# Patient Record
Sex: Male | Born: 1980
Health system: Southern US, Community
[De-identification: ages and names within clinical notes are randomized; demographics above are authoritative.]

## PROBLEM LIST (undated history)

## (undated) DIAGNOSIS — I10 Essential (primary) hypertension: Secondary | ICD-10-CM

## (undated) DIAGNOSIS — J45909 Unspecified asthma, uncomplicated: Secondary | ICD-10-CM

## (undated) DIAGNOSIS — K219 Gastro-esophageal reflux disease without esophagitis: Secondary | ICD-10-CM

## (undated) DIAGNOSIS — T7840XA Allergy, unspecified, initial encounter: Secondary | ICD-10-CM

## (undated) HISTORY — DX: Allergy, unspecified, initial encounter: T78.40XA

## (undated) HISTORY — DX: Essential (primary) hypertension: I10

## (undated) HISTORY — DX: Gastro-esophageal reflux disease without esophagitis: K21.9

## (undated) HISTORY — DX: Unspecified asthma, uncomplicated: J45.909

## (undated) HISTORY — PX: NO PAST SURGERIES: SHX2092

---

## 2000-04-02 ENCOUNTER — Emergency Department (HOSPITAL_COMMUNITY): Admission: EM | Admit: 2000-04-02 | Discharge: 2000-04-02 | Payer: Self-pay | Admitting: Emergency Medicine

## 2000-04-03 ENCOUNTER — Encounter: Payer: Self-pay | Admitting: Emergency Medicine

## 2012-11-05 ENCOUNTER — Ambulatory Visit (INDEPENDENT_AMBULATORY_CARE_PROVIDER_SITE_OTHER): Payer: BC Managed Care – PPO | Admitting: Family Medicine

## 2012-11-05 VITALS — BP 144/96 | HR 89 | Temp 98.3°F | Resp 16 | Ht 73.0 in | Wt 222.4 lb

## 2012-11-05 DIAGNOSIS — M79622 Pain in left upper arm: Secondary | ICD-10-CM

## 2012-11-05 DIAGNOSIS — K911 Postgastric surgery syndromes: Secondary | ICD-10-CM

## 2012-11-05 DIAGNOSIS — K219 Gastro-esophageal reflux disease without esophagitis: Secondary | ICD-10-CM

## 2012-11-05 DIAGNOSIS — R079 Chest pain, unspecified: Secondary | ICD-10-CM

## 2012-11-05 DIAGNOSIS — R109 Unspecified abdominal pain: Secondary | ICD-10-CM

## 2012-11-05 DIAGNOSIS — G5622 Lesion of ulnar nerve, left upper limb: Secondary | ICD-10-CM

## 2012-11-05 DIAGNOSIS — M79609 Pain in unspecified limb: Secondary | ICD-10-CM

## 2012-11-05 DIAGNOSIS — K625 Hemorrhage of anus and rectum: Secondary | ICD-10-CM

## 2012-11-05 LAB — POCT CBC
Granulocyte percent: 59.7 %G (ref 37–80)
HCT, POC: 48.4 % (ref 43.5–53.7)
Hemoglobin: 15.9 g/dL (ref 14.1–18.1)
Lymph, poc: 2.4 (ref 0.6–3.4)
MCH, POC: 29.8 pg (ref 27–31.2)
MCHC: 32.9 g/dL (ref 31.8–35.4)
MCV: 90.9 fL (ref 80–97)
MID (cbc): 0.7 (ref 0–0.9)
MPV: 10.4 fL (ref 0–99.8)
POC Granulocyte: 4.5 (ref 2–6.9)
POC LYMPH PERCENT: 31.3 %L (ref 10–50)
POC MID %: 9 %M (ref 0–12)
Platelet Count, POC: 256 10*3/uL (ref 142–424)
RBC: 5.33 M/uL (ref 4.69–6.13)
RDW, POC: 13.9 %
WBC: 7.6 10*3/uL (ref 4.6–10.2)

## 2012-11-05 LAB — COMPREHENSIVE METABOLIC PANEL
ALT: 114 U/L — ABNORMAL HIGH (ref 0–53)
AST: 51 U/L — ABNORMAL HIGH (ref 0–37)
Albumin: 5.2 g/dL (ref 3.5–5.2)
Alkaline Phosphatase: 42 U/L (ref 39–117)
BUN: 10 mg/dL (ref 6–23)
CO2: 26 mEq/L (ref 19–32)
Calcium: 10.1 mg/dL (ref 8.4–10.5)
Chloride: 103 mEq/L (ref 96–112)
Creat: 0.99 mg/dL (ref 0.50–1.35)
Glucose, Bld: 74 mg/dL (ref 70–99)
Potassium: 4.2 mEq/L (ref 3.5–5.3)
Sodium: 140 mEq/L (ref 135–145)
Total Bilirubin: 0.8 mg/dL (ref 0.3–1.2)
Total Protein: 7.8 g/dL (ref 6.0–8.3)

## 2012-11-05 LAB — IFOBT (OCCULT BLOOD): IFOBT: NEGATIVE

## 2012-11-05 MED ORDER — PANTOPRAZOLE SODIUM 40 MG PO TBEC: 40.0000 mg | DELAYED_RELEASE_TABLET | Freq: Every day | ORAL | Status: DC

## 2012-11-05 MED ORDER — PREDNISONE 20 MG PO TABS: ORAL_TABLET | ORAL | Status: DC

## 2012-11-05 NOTE — Progress Notes (Signed)
Subjective: 32 year old male who has generally been healthy. He has several complaints today. He's been having pain in his left upper arm to the posterior lateral aspect of it, going up into the left upper back above the scapula. Knows of no specific injury. He has no neck complaints. He does have intermittent numbness in the fourth and fifth fingers of his left hand.  He has a long history of gastroesophageal reflux and heartburn. He usually takes some TUMS and it will calm down for a while, then come back. He tried Prilosec OTC for 2 weeks and it helped but the pain came back. He also describes having some generalized low abdominal pain. He has a dumping syndrome, and when he has the urge to go he has to go. He passed gas and had some mucus come out recently, which when he checked and wiped himself he did see there is blind present. In the mucus.  He is working at a [employer]ater[employer] a computer most of the time. He is married has 2 children. Does not get a lot of regular exercise other than playing with children.  Objective: Pleasant gentleman no major distress. TMs normal. Throat clear. Neck supple without significant nodes. No crepitance was noted. Chest is clear to auscultation. Heart regular without murmurs. He has no gross anomalies of his left shoulder or upper arm or hand. Grip is symmetrical. His abdomen had normal bowel sounds, soft without mass or tenderness. Normal male external genitalia. Digital rectal exam was normal. No anal lesions were noted. Hemoccult was taken. He does have a little cyst in the soft tissues of the left side of his neck.  Assessment: GERD new left shoulder and arm pain Left ulnar neuropathy Abdominal pain Dumping syndrome  Plan: Check CBC, C. met, immature, and H. pylori. EKG was done and was normal.  Results for orders placed in visit on 11/05/12  POCT CBC      Result Value Range   WBC 7.6  4.6 - 10.2 K/uL   Lymph, poc 2.4  0.6 - 3.4   POC LYMPH PERCENT 31.3   10 - 50 %L   MID (cbc) 0.7  0 - 0.9   POC MID % 9.0  0 - 12 %M   POC Granulocyte 4.5  2 - 6.9   Granulocyte percent 59.7  37 - 80 %G   RBC 5.33  4.69 - 6.13 M/uL   Hemoglobin 15.9  14.1 - 18.1 g/dL   HCT, POC 16.1  09.6 - 53.7 %   MCV 90.9  80 - 97 fL   MCH, POC 29.8  27 - 31.2 pg   MCHC 32.9  31.8 - 35.4 g/dL   RDW, POC 04.5     Platelet Count, POC 256  142 - 424 K/uL   MPV 10.4  0 - 99.8 fL  IFOBT (OCCULT BLOOD)      Result Value Range   IFOBT Negative     I do not know exactly what is causing the arm pain. However I think it's mechanical in some fashion that I will treat with prednisone. Treat his stomach with Protonix while we are waiting for the blood work come back.

## 2012-11-05 NOTE — Patient Instructions (Addendum)
Take the prednisone 3 daily for 2 days, then 2 daily for 2 days, then one daily for 2 days. Take with food  Take the pantoprazole one daily for stomach acid reduction  I will let you know the results of your labs and we will proceed from there

## 2012-11-06 LAB — H. PYLORI ANTIBODY, IGG: H Pylori IgG: <0.4 {ISR}

## 2013-08-18 ENCOUNTER — Ambulatory Visit (INDEPENDENT_AMBULATORY_CARE_PROVIDER_SITE_OTHER): Payer: BC Managed Care – PPO | Admitting: Physician Assistant

## 2013-08-18 VITALS — BP 122/88 | HR 90 | Temp 97.7°F | Resp 16 | Ht 73.0 in | Wt 203.0 lb

## 2013-08-18 DIAGNOSIS — J45909 Unspecified asthma, uncomplicated: Secondary | ICD-10-CM

## 2013-08-18 DIAGNOSIS — K219 Gastro-esophageal reflux disease without esophagitis: Secondary | ICD-10-CM

## 2013-08-18 MED ORDER — RANITIDINE HCL 300 MG PO TABS: 300.0000 mg | ORAL_TABLET | Freq: Every day | ORAL | Status: DC

## 2013-08-18 MED ORDER — ALBUTEROL SULFATE HFA 108 (90 BASE) MCG/ACT IN AERS: 2.0000 | INHALATION_SPRAY | Freq: Four times a day (QID) | RESPIRATORY_TRACT | Status: DC | PRN

## 2013-08-18 MED ORDER — GUAIFENESIN ER 1200 MG PO TB12: 1.0000 | ORAL_TABLET | Freq: Two times a day (BID) | ORAL | Status: AC

## 2013-08-18 MED ORDER — AZITHROMYCIN 250 MG PO TABS: ORAL_TABLET | ORAL | Status: AC

## 2013-08-18 NOTE — Progress Notes (Signed)
   Subjective:    Patient ID: Jeffrey Carrillo, male    DOB: 1980/11/28, 32 y.o.   MRN: [id number]  HPI Pt presents to clinic with 3 week h/o congestion and cough.  Started as a cold with some nasal congestion and PND.  He still has tightness in the chest and a productive cough in the am - then he feels pretty good during the day and then the chest tightness comes back at night. He has h/o childhood asthma but has not had problems with it in a long time.  He has not been using OTC meds.  He would also like a refill of his Protonix.  He changed his dit after he was put on the medication and his symptoms have gotten much better.  He used to have problems  4-6 days a week and now he does not even have it weekly - but he has been using the protonix prn.    Review of Systems  Constitutional: Positive for fever (subjective). Negative for chills.  HENT: Positive for postnasal drip and sore throat (irritated). Negative for congestion and nosebleeds.   Respiratory: Positive for cough (yellow/orange sputum mainly in the am).   Gastrointestinal: Positive for nausea (last pm only).  Musculoskeletal: Negative for myalgias.  Neurological: Negative for headaches.  Psychiatric/Behavioral: Negative for sleep disturbance.       Objective:   Physical Exam  Constitutional: He appears well-developed and well-nourished.  HENT:  Head: Normocephalic and atraumatic.  Right Ear: External ear normal.  Left Ear: External ear normal.  Eyes: Conjunctivae are normal.  Neck: Normal range of motion.  Cardiovascular: Normal rate, regular rhythm and normal heart sounds.   No murmur heard. Pulmonary/Chest: Effort normal. He has wheezes (expiratory wheezing at bases - increased with forced expiration).  Lymphadenopathy:    He has no cervical adenopathy.  Skin: Skin is warm and dry.  Psychiatric: He has a normal mood and affect. His behavior is normal. Judgment and thought content normal.       Assessment & Plan:    Esophageal reflux - Due to amount of heartburn episodes and him not needing medication daily we will try prn Zantac use - if he finds that this does not work he will let me know but I expect with his change in eating he should be ok with the prn usage.  Plan: ranitidine (ZANTAC) 300 MG tablet  Asthmatic bronchitis - Due to length of time will cover with abx.  Plan: albuterol (PROVENTIL HFA;VENTOLIN HFA) 108 (90 BASE) MCG/ACT inhaler, Guaifenesin (MUCINEX MAXIMUM STRENGTH) 1200 MG TB12, azithromycin (ZITHROMAX Z-PAK) 250 MG tablet  Benny Lennert PA-C 08/18/2013 11:19 AM

## 2013-10-16 ENCOUNTER — Other Ambulatory Visit: Payer: Self-pay | Admitting: Physician Assistant

## 2014-02-18 ENCOUNTER — Other Ambulatory Visit: Payer: Self-pay | Admitting: Family Medicine

## 2014-04-12 ENCOUNTER — Other Ambulatory Visit: Payer: Self-pay | Admitting: Physician Assistant

## 2016-01-08 LAB — HEPATIC FUNCTION PANEL
ALT: 93 — AB (ref 10–40)
AST: 35 (ref 14–40)
Alkaline Phosphatase: 47 (ref 25–125)
Bilirubin, Total: 0.6

## 2016-01-08 LAB — CBC AND DIFFERENTIAL
HEMATOCRIT: 43 (ref 41–53)
HEMOGLOBIN: 14.7 (ref 13.5–17.5)
Platelets: 254 (ref 150–399)
WBC: 6.3

## 2016-01-08 LAB — BASIC METABOLIC PANEL
BUN: 13 (ref 4–21)
Creatinine: 0.9 (ref 0.6–1.3)
GLUCOSE: 93
POTASSIUM: 4.4 (ref 3.4–5.3)
SODIUM: 143 (ref 137–147)

## 2016-01-08 LAB — LIPID PANEL
CHOLESTEROL: 181 (ref 0–200)
HDL: 33 — AB (ref 35–70)
LDL CALC: 99
TRIGLYCERIDES: 243 — AB (ref 40–160)

## 2017-06-14 ENCOUNTER — Emergency Department (HOSPITAL_COMMUNITY)
Admission: EM | Admit: 2017-06-14 | Discharge: 2017-06-14 | Disposition: A | Discharge status: Home / Self Care | Payer: BLUE CROSS/BLUE SHIELD | Attending: Emergency Medicine | Admitting: Emergency Medicine

## 2017-06-14 ENCOUNTER — Emergency Department (HOSPITAL_COMMUNITY): Payer: BLUE CROSS/BLUE SHIELD

## 2017-06-14 ENCOUNTER — Encounter (HOSPITAL_COMMUNITY): Payer: Self-pay

## 2017-06-14 DIAGNOSIS — S161XXA Strain of muscle, fascia and tendon at neck level, initial encounter: Secondary | ICD-10-CM | POA: Diagnosis not present

## 2017-06-14 DIAGNOSIS — Z87891 Personal history of nicotine dependence: Secondary | ICD-10-CM | POA: Insufficient documentation

## 2017-06-14 DIAGNOSIS — Y999 Unspecified external cause status: Secondary | ICD-10-CM | POA: Insufficient documentation

## 2017-06-14 DIAGNOSIS — Y939 Activity, unspecified: Secondary | ICD-10-CM | POA: Diagnosis not present

## 2017-06-14 DIAGNOSIS — Z23 Encounter for immunization: Secondary | ICD-10-CM | POA: Insufficient documentation

## 2017-06-14 DIAGNOSIS — J45909 Unspecified asthma, uncomplicated: Secondary | ICD-10-CM | POA: Diagnosis not present

## 2017-06-14 DIAGNOSIS — Y92410 Unspecified street and highway as the place of occurrence of the external cause: Secondary | ICD-10-CM | POA: Diagnosis not present

## 2017-06-14 DIAGNOSIS — S20219A Contusion of unspecified front wall of thorax, initial encounter: Secondary | ICD-10-CM | POA: Insufficient documentation

## 2017-06-14 DIAGNOSIS — Z79899 Other long term (current) drug therapy: Secondary | ICD-10-CM | POA: Diagnosis not present

## 2017-06-14 DIAGNOSIS — R0789 Other chest pain: Secondary | ICD-10-CM | POA: Diagnosis present

## 2017-06-14 DIAGNOSIS — S93402A Sprain of unspecified ligament of left ankle, initial encounter: Secondary | ICD-10-CM | POA: Insufficient documentation

## 2017-06-14 MED ORDER — TETANUS-DIPHTH-ACELL PERTUSSIS 5-2.5-18.5 LF-MCG/0.5 IM SUSP
0.5000 mL | Freq: Once | INTRAMUSCULAR | Status: AC
  Administered 2017-06-14: 0.5 mL via INTRAMUSCULAR
  Filled 2017-06-14: qty 0.5

## 2017-06-14 MED ORDER — TRAMADOL HCL 50 MG PO TABS: ORAL_TABLET | ORAL | 0 refills | Status: DC

## 2017-06-14 MED ORDER — IBUPROFEN 800 MG PO TABS: 800.0000 mg | ORAL_TABLET | Freq: Three times a day (TID) | ORAL | 0 refills | Status: DC | PRN
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
## 2017-06-14 NOTE — Discharge Instructions (Signed)
Follow-up with Dr. Aundria Rud next week for recheck. Keep leg elevated as much as possible
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
## 2018-01-25 DIAGNOSIS — J029 Acute pharyngitis, unspecified: Secondary | ICD-10-CM | POA: Diagnosis not present

## 2018-01-28 ENCOUNTER — Encounter: Payer: Self-pay | Admitting: Family Medicine

## 2018-01-28 ENCOUNTER — Ambulatory Visit: Payer: 59 | Admitting: Family Medicine

## 2018-01-28 VITALS — BP 138/82 | HR 93 | Temp 98.1°F | Ht 73.0 in | Wt 245.4 lb

## 2018-01-28 DIAGNOSIS — G8929 Other chronic pain: Secondary | ICD-10-CM | POA: Diagnosis not present

## 2018-01-28 DIAGNOSIS — M25561 Pain in right knee: Secondary | ICD-10-CM | POA: Diagnosis not present

## 2018-01-28 DIAGNOSIS — J4541 Moderate persistent asthma with (acute) exacerbation: Secondary | ICD-10-CM

## 2018-01-28 DIAGNOSIS — Z0001 Encounter for general adult medical examination with abnormal findings: Secondary | ICD-10-CM

## 2018-01-28 DIAGNOSIS — M25562 Pain in left knee: Secondary | ICD-10-CM | POA: Diagnosis not present

## 2018-01-28 DIAGNOSIS — Z131 Encounter for screening for diabetes mellitus: Secondary | ICD-10-CM | POA: Insufficient documentation

## 2018-01-28 DIAGNOSIS — Z0000 Encounter for general adult medical examination without abnormal findings: Secondary | ICD-10-CM | POA: Diagnosis not present

## 2018-01-28 DIAGNOSIS — B349 Viral infection, unspecified: Secondary | ICD-10-CM | POA: Diagnosis not present

## 2018-01-28 DIAGNOSIS — R945 Abnormal results of liver function studies: Secondary | ICD-10-CM | POA: Diagnosis not present

## 2018-01-28 DIAGNOSIS — R7989 Other specified abnormal findings of blood chemistry: Secondary | ICD-10-CM

## 2018-01-28 DIAGNOSIS — I10 Essential (primary) hypertension: Secondary | ICD-10-CM | POA: Diagnosis not present

## 2018-01-28 DIAGNOSIS — M25569 Pain in unspecified knee: Secondary | ICD-10-CM

## 2018-01-28 DIAGNOSIS — E7800 Pure hypercholesterolemia, unspecified: Secondary | ICD-10-CM

## 2018-01-28 DIAGNOSIS — J45909 Unspecified asthma, uncomplicated: Secondary | ICD-10-CM | POA: Insufficient documentation

## 2018-01-28 LAB — COMPREHENSIVE METABOLIC PANEL
ALK PHOS: 45 U/L (ref 39–117)
ALT: 62 U/L — ABNORMAL HIGH (ref 0–53)
AST: 23 U/L (ref 0–37)
Albumin: 4.7 g/dL (ref 3.5–5.2)
BUN: 11 mg/dL (ref 6–23)
CHLORIDE: 105 meq/L (ref 96–112)
CO2: 24 meq/L (ref 19–32)
Calcium: 9.8 mg/dL (ref 8.4–10.5)
Creatinine, Ser: 0.82 mg/dL (ref 0.40–1.50)
GFR: 112.1 mL/min (ref >60.00)
GLUCOSE: 96 mg/dL (ref 70–99)
POTASSIUM: 4.1 meq/L (ref 3.5–5.1)
SODIUM: 139 meq/L (ref 135–145)
Total Bilirubin: 0.8 mg/dL (ref 0.2–1.2)
Total Protein: 7.3 g/dL (ref 6.0–8.3)

## 2018-01-28 LAB — CBC
HEMATOCRIT: 43.1 % (ref 39.0–52.0)
HEMOGLOBIN: 14.9 g/dL (ref 13.0–17.0)
MCHC: 34.6 g/dL (ref 30.0–36.0)
MCV: 87 fl (ref 78.0–100.0)
Platelets: 280 10*3/uL (ref 150.0–400.0)
RBC: 4.95 Mil/uL (ref 4.22–5.81)
RDW: 14 % (ref 11.5–15.5)
WBC: 8.4 10*3/uL (ref 4.0–10.5)

## 2018-01-28 LAB — URINALYSIS, ROUTINE W REFLEX MICROSCOPIC
Bilirubin Urine: NEGATIVE
Ketones, ur: NEGATIVE
Leukocytes, UA: NEGATIVE
Nitrite: NEGATIVE
Specific Gravity, Urine: >=1.03 — AB (ref 1.000–1.030)
TOTAL PROTEIN, URINE-UPE24: NEGATIVE
URINE GLUCOSE: NEGATIVE
UROBILINOGEN UA: 0.2 (ref 0.0–1.0)
pH: 6 (ref 5.0–8.0)

## 2018-01-28 LAB — LIPID PANEL
CHOL/HDL RATIO: 5
Cholesterol: 162 mg/dL (ref 0–200)
HDL: 30.7 mg/dL — ABNORMAL LOW (ref >39.00)
NonHDL: 131.07
Triglycerides: 237 mg/dL — ABNORMAL HIGH (ref 0.0–149.0)
VLDL: 47.4 mg/dL — AB (ref 0.0–40.0)

## 2018-01-28 LAB — LDL CHOLESTEROL, DIRECT: LDL DIRECT: 101 mg/dL

## 2018-01-28 LAB — PEAK FLOW METER: Peak Systolic Velocity: 550 cm/s

## 2018-01-28 LAB — TSH: TSH: 1.13 u[IU]/mL (ref 0.35–4.50)

## 2018-01-28 MED ORDER — METHYLPREDNISOLONE SODIUM SUCC 125 MG IJ SOLR
125.0000 mg | Freq: Once | INTRAMUSCULAR | Status: AC
  Administered 2018-01-28: 125 mg via INTRAMUSCULAR

## 2018-01-28 MED ORDER — PREDNISONE 20 MG PO TABS: 20.0000 mg | ORAL_TABLET | Freq: Two times a day (BID) | ORAL | 0 refills | Status: AC
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
## 2018-01-28 NOTE — Patient Instructions (Signed)
Asthma, Acute Bronchospasm Acute bronchospasm caused by asthma is also referred to as an asthma attack. Bronchospasm means your air passages become narrowed. The narrowing is caused by inflammation and tightening of the muscles in the air tubes (bronchi) in your lungs. This can make it hard to breathe or cause you to wheeze and cough. What are the causes? Possible triggers are:  Animal dander from the skin, hair, or feathers of animals.  Dust mites contained in house dust.  Cockroaches.  Pollen from trees or grass.  Mold.  Cigarette or tobacco smoke.  Air pollutants such as dust, household cleaners, hair sprays, aerosol sprays, paint fumes, strong chemicals, or strong odors.  Cold air or weather changes. Cold air may trigger inflammation. Winds increase molds and pollens in the air.  Strong emotions such as crying or laughing hard.  Stress.  Certain medicines such as aspirin or beta-blockers.  Sulfites in foods and drinks, such as dried fruits and wine.  Infections or inflammatory conditions, such as a flu, cold, or inflammation of the nasal membranes (rhinitis).  Gastroesophageal reflux disease (GERD). GERD is a condition where stomach acid backs up into your esophagus.  Exercise or strenuous activity.  What are the signs or symptoms?  Wheezing.  Excessive coughing, particularly at night.  Chest tightness.  Shortness of breath. How is this diagnosed? Your health care provider will ask you about your medical history and perform a physical exam. A chest X-ray or blood testing may be performed to look for other causes of your symptoms or other conditions that may have triggered your asthma attack. How is this treated? Treatment is aimed at reducing inflammation and opening up the airways in your lungs. Most asthma attacks are treated with inhaled medicines. These include quick relief or rescue medicines (such as bronchodilators) and controller medicines (such as inhaled  corticosteroids). These medicines are sometimes given through an inhaler or a nebulizer. Systemic steroid medicine taken by mouth or given through an IV tube also can be used to reduce the inflammation when an attack is moderate or severe. Antibiotic medicines are only used if a bacterial infection is present. Follow these instructions at home:  Rest.  Drink plenty of liquids. This helps the mucus to remain thin and be easily coughed up. Only use caffeine in moderation and do not use alcohol until you have recovered from your illness.  Do not smoke. Avoid being exposed to secondhand smoke.  You play a critical role in keeping yourself in good health. Avoid exposure to things that cause you to wheeze or to have breathing problems.  Keep your medicines up-to-date and available. Carefully follow your health care provider's treatment plan.  Take your medicine exactly as prescribed.  When pollen or pollution is bad, keep windows closed and use an air conditioner or go to places with air conditioning.  Asthma requires careful medical care. See your health care provider for a follow-up as advised. If you are more than [redacted] weeks pregnant and you were prescribed any new medicines, let your obstetrician know about the visit and how you are doing. Follow up with your health care provider as directed.  After you have recovered from your asthma attack, make an appointment with your outpatient doctor to talk about ways to reduce the likelihood of future attacks. If you do not have a doctor who manages your asthma, make an appointment with a primary care doctor to discuss your asthma. Get help right away if:  You are getting worse.  You have trouble breathing. If severe, call your local emergency services (911 in the [place]  You develop chest pain or discomfort.  You are vomiting.  You are not able to keep fluids down.  You are coughing up yellow, green, brown, or bloody sputum.  You have a fever  and your symptoms suddenly get worse.  You have trouble swallowing. This information is not intended to replace advice given to you by your health care provider. Make sure you discuss any questions you have with your health care provider. Document Released: 12/05/2006 Document Revised: 02/01/2016 Document Reviewed: 02/25/2013 Elsevier Interactive Patient Education  2017 [employer]  DASH Eating Plan DASH stands for "Dietary Approaches to Stop Hypertension." The DASH eating plan is a healthy eating plan that has been shown to reduce high blood pressure (hypertension). It may also reduce your risk for type 2 diabetes, heart disease, and stroke. The DASH eating plan may also help with weight loss. What are tips for following this plan? General guidelines  Avoid eating more than 2,300 mg (milligrams) of salt (sodium) a day. If you have hypertension, you may need to reduce your sodium intake to 1,500 mg a day.  Limit alcohol intake to no more than 1 drink a day for nonpregnant women and 2 drinks a day for men. One drink equals 12 oz of beer, 5 oz of wine, or 1 oz of hard liquor.  Work with your health care provider to maintain a healthy body weight or to lose weight. Ask what an ideal weight is for you.  Get at least 30 minutes of exercise that causes your heart to beat faster (aerobic exercise) most days of the week. Activities may include walking, swimming, or biking.  Work with your health care provider or diet and nutrition specialist (dietitian) to adjust your eating plan to your individual calorie needs. Reading food labels  Check food labels for the amount of sodium per serving. Choose foods with less than 5 percent of the Daily Value of sodium. Generally, foods with less than 300 mg of sodium per serving fit into this eating plan.  To find whole grains, look for the word "whole" as the first word in the ingredient list. Shopping  Buy products labeled as "low-sodium" or "no salt  added."  Buy fresh foods. Avoid canned foods and premade or frozen meals. Cooking  Avoid adding salt when cooking. Use salt-free seasonings or herbs instead of table salt or sea salt. Check with your health care provider or pharmacist before using salt substitutes.  Do not fry foods. Cook foods using healthy methods such as baking, boiling, grilling, and broiling instead.  Cook with heart-healthy oils, such as olive, canola, soybean, or sunflower oil. Meal planning   Eat a balanced diet that includes: ? 5 or more servings of fruits and vegetables each day. At each meal, try to fill half of your plate with fruits and vegetables. ? Up to 6-8 servings of whole grains each day. ? Less than 6 oz of lean meat, poultry, or fish each day. A 3-oz serving of meat is about the same size as a deck of cards. One egg equals 1 oz. ? 2 servings of low-fat dairy each day. ? A serving of nuts, seeds, or beans 5 times each week. ? Heart-healthy fats. Healthy fats called Omega-3 fatty acids are found in foods such as flaxseeds and coldwater fish, like sardines, salmon, and mackerel.  Limit how much you eat of the following: ? Canned or  prepackaged foods. ? Food that is high in trans fat, such as fried foods. ? Food that is high in saturated fat, such as fatty meat. ? Sweets, desserts, sugary drinks, and other foods with added sugar. ? Full-fat dairy products.  Do not salt foods before eating.  Try to eat at least 2 vegetarian meals each week.  Eat more home-cooked food and less restaurant, buffet, and fast food.  When eating at a restaurant, ask that your food be prepared with less salt or no salt, if possible. What foods are recommended? The items listed may not be a complete list. Talk with your dietitian about what dietary choices are best for you. Grains Whole-grain or whole-wheat bread. Whole-grain or whole-wheat pasta. Brown rice. Orpah Cobb. Bulgur. Whole-grain and low-sodium cereals.  Pita bread. Low-fat, low-sodium crackers. Whole-wheat flour tortillas. Vegetables Fresh or frozen vegetables (raw, steamed, roasted, or grilled). Low-sodium or reduced-sodium tomato and vegetable juice. Low-sodium or reduced-sodium tomato sauce and tomato paste. Low-sodium or reduced-sodium canned vegetables. Fruits All fresh, dried, or frozen fruit. Canned fruit in natural juice (without added sugar). Meat and other protein foods Skinless chicken or [place]. Ground chicken or [place]. Pork with fat trimmed off. Fish and seafood. Egg whites. Dried beans, peas, or lentils. Unsalted nuts, nut butters, and seeds. Unsalted canned beans. Lean cuts of beef with fat trimmed off. Low-sodium, lean deli meat. Dairy Low-fat (1%) or fat-free (skim) milk. Fat-free, low-fat, or reduced-fat cheeses. Nonfat, low-sodium ricotta or cottage cheese. Low-fat or nonfat yogurt. Low-fat, low-sodium cheese. Fats and oils Soft margarine without trans fats. Vegetable oil. Low-fat, reduced-fat, or light mayonnaise and salad dressings (reduced-sodium). Canola, safflower, olive, soybean, and sunflower oils. Avocado. Seasoning and other foods Herbs. Spices. Seasoning mixes without salt. Unsalted popcorn and pretzels. Fat-free sweets. What foods are not recommended? The items listed may not be a complete list. Talk with your dietitian about what dietary choices are best for you. Grains Baked goods made with fat, such as croissants, muffins, or some breads. Dry pasta or rice meal packs. Vegetables Creamed or fried vegetables. Vegetables in a cheese sauce. Regular canned vegetables (not low-sodium or reduced-sodium). Regular canned tomato sauce and paste (not low-sodium or reduced-sodium). Regular tomato and vegetable juice (not low-sodium or reduced-sodium). Rosita Fire. Olives. Fruits Canned fruit in a light or heavy syrup. Fried fruit. Fruit in cream or butter sauce. Meat and other protein foods Fatty cuts of meat. Ribs. Fried  meat. Tomasa Blase. Sausage. Bologna and other processed lunch meats. Salami. Fatback. Hotdogs. Bratwurst. Salted nuts and seeds. Canned beans with added salt. Canned or smoked fish. Whole eggs or egg yolks. Chicken or [place] with skin. Dairy Whole or 2% milk, cream, and half-and-half. Whole or full-fat cream cheese. Whole-fat or sweetened yogurt. Full-fat cheese. Nondairy creamers. Whipped toppings. Processed cheese and cheese spreads. Fats and oils Butter. Stick margarine. Lard. Shortening. Ghee. Bacon fat. Tropical oils, such as coconut, palm kernel, or palm oil. Seasoning and other foods Salted popcorn and pretzels. Onion salt, garlic salt, seasoned salt, table salt, and sea salt. Worcestershire sauce. Tartar sauce. Barbecue sauce. Teriyaki sauce. Soy sauce, including reduced-sodium. Steak sauce. Canned and packaged gravies. Fish sauce. Oyster sauce. Cocktail sauce. Horseradish that you find on the shelf. Ketchup. Mustard. Meat flavorings and tenderizers. Bouillon cubes. Hot sauce and Tabasco sauce. Premade or packaged marinades. Premade or packaged taco seasonings. Relishes. Regular salad dressings. Where to find more information:  National Heart, Lung, and Blood Institute: PopSteam.is  American Heart Association: www.heart.org Summary  The DASH  eating plan is a healthy eating plan that has been shown to reduce high blood pressure (hypertension). It may also reduce your risk for type 2 diabetes, heart disease, and stroke.  With the DASH eating plan, you should limit salt (sodium) intake to 2,300 mg a day. If you have hypertension, you may need to reduce your sodium intake to 1,500 mg a day.  When on the DASH eating plan, aim to eat more fresh fruits and vegetables, whole grains, lean proteins, low-fat dairy, and heart-healthy fats.  Work with your health care provider or diet and nutrition specialist (dietitian) to adjust your eating plan to your individual calorie needs. This information is  not intended to replace advice given to you by your health care provider. Make sure you discuss any questions you have with your health care provider. Document Released: 08/09/2011 Document Revised: 08/13/2016 Document Reviewed: 08/13/2016 Elsevier Interactive Patient Education  2018 [employer].  Health Maintenance, Male A healthy lifestyle and preventive care is important for your health and wellness. Ask your health care provider about what schedule of regular examinations is right for you. What should I know about weight and diet? Eat a Healthy Diet  Eat plenty of vegetables, fruits, whole grains, low-fat dairy products, and lean protein.  Do not eat a lot of foods high in solid fats, added sugars, or salt.  Maintain a Healthy Weight Regular exercise can help you achieve or maintain a healthy weight. You should:  Do at least 150 minutes of exercise each week. The exercise should increase your heart rate and make you sweat (moderate-intensity exercise).  Do strength-training exercises at least twice a week.  Watch Your Levels of Cholesterol and Blood Lipids  Have your blood tested for lipids and cholesterol every 5 years starting at 38 years of age. If you are at high risk for heart disease, you should start having your blood tested when you are 38 years old. You may need to have your cholesterol levels checked more often if: ? Your lipid or cholesterol levels are high. ? You are older than 38 years of age. ? You are at high risk for heart disease.  What should I know about cancer screening? Many types of cancers can be detected early and may often be prevented. Lung Cancer  You should be screened every year for lung cancer if: ? You are a current smoker who has smoked for at least 30 years. ? You are a former smoker who has quit within the past 15 years.  Talk to your health care provider about your screening options, when you should start screening, and how often you should  be screened.  Colorectal Cancer  Routine colorectal cancer screening usually begins at 38 years of age and should be repeated every 5-10 years until you are 38 years old. You may need to be screened more often if early forms of precancerous polyps or small growths are found. Your health care provider may recommend screening at an earlier age if you have risk factors for colon cancer.  Your health care provider may recommend using home test kits to check for hidden blood in the stool.  A small camera at the end of a tube can be used to examine your colon (sigmoidoscopy or colonoscopy). This checks for the earliest forms of colorectal cancer.  Prostate and Testicular Cancer  Depending on your age and overall health, your health care provider may do certain tests to screen for prostate and testicular cancer.  Talk  to your health care provider about any symptoms or concerns you have about testicular or prostate cancer.  Skin Cancer  Check your skin from head to toe regularly.  Tell your health care provider about any new moles or changes in moles, especially if: ? There is a change in a mole's size, shape, or color. ? You have a mole that is larger than a pencil eraser.  Always use sunscreen. Apply sunscreen liberally and repeat throughout the day.  Protect yourself by wearing long sleeves, pants, a wide-brimmed hat, and sunglasses when outside.  What should I know about heart disease, diabetes, and high blood pressure?  If you are 71-68 years of age, have your blood pressure checked every 3-5 years. If you are 65 years of age or older, have your blood pressure checked every year. You should have your blood pressure measured twice-once when you are at a hospital or clinic, and once when you are not at a hospital or clinic. Record the average of the two measurements. To check your blood pressure when you are not at a hospital or clinic, you can use: ? An automated blood pressure machine at  a pharmacy. ? A home blood pressure monitor.  Talk to your health care provider about your target blood pressure.  If you are between 51-4 years old, ask your health care provider if you should take aspirin to prevent heart disease.  Have regular diabetes screenings by checking your fasting blood sugar level. ? If you are at a normal weight and have a low risk for diabetes, have this test once every three years after the age of 21. ? If you are overweight and have a high risk for diabetes, consider being tested at a younger age or more often.  A one-time screening for abdominal aortic aneurysm (AAA) by ultrasound is recommended for men aged 65-75 years who are current or former smokers. What should I know about preventing infection? Hepatitis B If you have a higher risk for hepatitis B, you should be screened for this virus. Talk with your health care provider to find out if you are at risk for hepatitis B infection. Hepatitis C Blood testing is recommended for:  Everyone born from [birth year] through 1966.  Anyone with known risk factors for hepatitis C.  Sexually Transmitted Diseases (STDs)  You should be screened each year for STDs including gonorrhea and chlamydia if: ? You are sexually active and are younger than 38 years of age. ? You are older than 38 years of age and your health care provider tells you that you are at risk for this type of infection. ? Your sexual activity has changed since you were last screened and you are at an increased risk for chlamydia or gonorrhea. Ask your health care provider if you are at risk.  Talk with your health care provider about whether you are at high risk of being infected with HIV. Your health care provider may recommend a prescription medicine to help prevent HIV infection.  What else can I do?  Schedule regular health, dental, and eye exams.  Stay current with your vaccines (immunizations).  Do not use any tobacco products, such as  cigarettes, chewing tobacco, and e-cigarettes. If you need help quitting, ask your health care provider.  Limit alcohol intake to no more than 2 drinks per day. One drink equals 12 ounces of beer, 5 ounces of wine, or 1 ounces of hard liquor.  Do not use street drugs.  Do not  share needles.  Ask your health care provider for help if you need support or information about quitting drugs.  Tell your health care provider if you often feel depressed.  Tell your health care provider if you have ever been abused or do not feel safe at home. This information is not intended to replace advice given to you by your health care provider. Make sure you discuss any questions you have with your health care provider. Document Released: 02/16/2008 Document Revised: 04/18/2016 Document Reviewed: 05/24/2015 Elsevier Interactive Patient Education  2018 [employer].  Exercising to [employer] Exercising can help you to lose weight. In order to lose weight through exercise, you need to do vigorous-intensity exercise. You can tell that you are exercising with vigorous intensity if you are breathing very hard and fast and cannot hold a conversation while exercising. Moderate-intensity exercise helps to maintain your current weight. You can tell that you are exercising at a moderate level if you have a higher heart rate and faster breathing, but you are still able to hold a conversation. How often should I exercise? Choose an activity that you enjoy and set realistic goals. Your health care provider can help you to make an activity plan that works for you. Exercise regularly as directed by your health care provider. This may include:  Doing resistance training twice each week, such as: ? Push-ups. ? Sit-ups. ? Lifting weights. ? Using resistance bands.  Doing a given intensity of exercise for a given amount of time. Choose from these options: ? 150 minutes of moderate-intensity exercise every week. ? 75  minutes of vigorous-intensity exercise every week. ? A mix of moderate-intensity and vigorous-intensity exercise every week.  Children, pregnant women, people who are out of shape, people who are overweight, and older adults may need to consult a health care provider for individual recommendations. If you have any sort of medical condition, be sure to consult your health care provider before starting a new exercise program. What are some activities that can help me to lose weight?  Walking at a rate of at least 4.5 miles an hour.  Jogging or running at a rate of 5 miles per hour.  Biking at a rate of at least 10 miles per hour.  Lap swimming.  Roller-skating or in-line skating.  Cross-country skiing.  Vigorous competitive sports, such as football, basketball, and soccer.  Jumping rope.  Aerobic dancing. How can I be more active in my day-to-day activities?  Use the stairs instead of the elevator.  Take a walk during your lunch break.  If you drive, park your car farther away from work or school.  If you take public transportation, get off one stop early and walk the rest of the way.  Make all of your phone calls while standing up and walking around.  Get up, stretch, and walk around every 30 minutes throughout the day. What guidelines should I follow while exercising?  Do not exercise so much that you hurt yourself, feel dizzy, or get very short of breath.  Consult your health care provider prior to starting a new exercise program.  Wear comfortable clothes and shoes with good support.  Drink plenty of water while you exercise to prevent dehydration or heat stroke. Body water is lost during exercise and must be replaced.  Work out until you breathe faster and your heart beats faster. This information is not intended to replace advice given to you by your health care provider. Make sure you discuss  any questions you have with your health care provider. Document  Released: 09/22/2010 Document Revised: 01/26/2016 Document Reviewed: 01/21/2014 Elsevier Interactive Patient Education  [employer].

## 2018-01-29 LAB — HIV ANTIBODY (ROUTINE TESTING W REFLEX): HIV 1&2 Ab, 4th Generation: NONREACTIVE

## 2018-01-31 ENCOUNTER — Encounter: Payer: Self-pay | Admitting: Family Medicine

## 2018-01-31 DIAGNOSIS — R7989 Other specified abnormal findings of blood chemistry: Secondary | ICD-10-CM | POA: Insufficient documentation

## 2018-01-31 DIAGNOSIS — R945 Abnormal results of liver function studies: Secondary | ICD-10-CM

## 2018-01-31 NOTE — Addendum Note (Signed)
Addended by: Andrez Grime on: 01/31/2018 09:02 AM   Modules accepted: Orders

## 2018-02-03 ENCOUNTER — Encounter: Payer: Self-pay | Admitting: Family Medicine

## 2018-02-03 ENCOUNTER — Ambulatory Visit
Admission: RE | Admit: 2018-02-03 | Discharge: 2018-02-03 | Disposition: A | Discharge status: Home / Self Care | Payer: 59 | Source: Ambulatory Visit | Attending: Family Medicine | Admitting: Family Medicine

## 2018-02-03 DIAGNOSIS — R945 Abnormal results of liver function studies: Secondary | ICD-10-CM | POA: Diagnosis not present

## 2018-02-03 DIAGNOSIS — R7989 Other specified abnormal findings of blood chemistry: Secondary | ICD-10-CM

## 2018-02-04 ENCOUNTER — Ambulatory Visit: Payer: 59 | Admitting: Family Medicine

## 2018-02-04 ENCOUNTER — Encounter: Payer: Self-pay | Admitting: Family Medicine

## 2018-02-04 VITALS — BP 136/80 | HR 77 | Ht 73.0 in | Wt 248.2 lb

## 2018-02-04 DIAGNOSIS — R748 Abnormal levels of other serum enzymes: Secondary | ICD-10-CM

## 2018-02-04 DIAGNOSIS — R932 Abnormal findings on diagnostic imaging of liver and biliary tract: Secondary | ICD-10-CM

## 2018-02-04 DIAGNOSIS — Z6832 Body mass index (BMI) 32.0-32.9, adult: Secondary | ICD-10-CM

## 2018-02-04 DIAGNOSIS — E782 Mixed hyperlipidemia: Secondary | ICD-10-CM

## 2018-02-04 DIAGNOSIS — E6609 Other obesity due to excess calories: Secondary | ICD-10-CM | POA: Insufficient documentation

## 2018-02-04 DIAGNOSIS — R945 Abnormal results of liver function studies: Secondary | ICD-10-CM | POA: Diagnosis not present

## 2018-02-04 DIAGNOSIS — R7401 Elevation of levels of liver transaminase levels: Secondary | ICD-10-CM | POA: Insufficient documentation

## 2018-02-04 LAB — HEPATIC FUNCTION PANEL
ALT: 79 U/L — ABNORMAL HIGH (ref 0–53)
AST: 23 U/L (ref 0–37)
Albumin: 4.5 g/dL (ref 3.5–5.2)
Alkaline Phosphatase: 42 U/L (ref 39–117)
BILIRUBIN DIRECT: 0.1 mg/dL (ref 0.0–0.3)
BILIRUBIN TOTAL: 0.4 mg/dL (ref 0.2–1.2)
TOTAL PROTEIN: 6.6 g/dL (ref 6.0–8.3)

## 2018-02-04 NOTE — Progress Notes (Signed)
Subjective:  Patient ID: Jeffrey Carrillo, male    DOB: Jan 19, 1981  Age: 37 y.o. MRN: [id number]  CC: Follow-up (ultrasound)   HPI Jeffrey Carrillo presents for follow-up of his abnormal liver ultrasound.  [place] physician suggested the possibility of cirrhosis.  Chart review does show that patient's liver enzymes have been elevated since 2014 in our medical record.  Patient rarely if ever drinks alcohol.  When he does it is no more than 1 serving.  He has used Tylenol episodically in the past but does not do so on a regular basis.  His father passed this past February of what sounds like adult respiratory distress syndrome.  He had a history of diabetes that had led to end-stage renal disease and was on chronic dialysis.  Chart review shows the patient's triglycerides have been moderately elevated.  His LFTs have been higher in the past.  Patient has no history of IV drug abuse or blood transfusions.  His wife is healthy as well.  Patient has noted bloating after consuming dairy products on occasion.  He has a history of dumping syndrome he tells me.  His stools are formed and do not float.  Outpatient Medications Prior to Visit  Medication Sig Dispense Refill  . BREO ELLIPTA 200-25 MCG/INH AEPB Inhale 1 puff daily.  0  . fexofenadine (ALLEGRA) 180 MG tablet Take 180 mg by mouth daily.    Marland Kitchen. lisinopril (PRINIVIL,ZESTRIL) 10 MG tablet Take 1 tablet by mouth daily.  3  . Omega-3 Fatty Acids (FISH OIL) 1200 MG CAPS Take 1 capsule by mouth daily.    . pantoprazole (PROTONIX) 40 MG tablet Take 1 tablet (40 mg total) by mouth daily. PATIENT NEEDS OFFICE VISIT FOR ADDITIONAL REFILLS 30 tablet 0  . predniSONE (DELTASONE) 20 MG tablet Take 1 tablet (20 mg total) by mouth 2 (two) times daily with a meal for 7 days. 14 tablet 0  . VENTOLIN HFA 108 (90 BASE) MCG/ACT inhaler INHALE 2 PUFFS INTO THE LUNGS EVERY 6 HOURS AS NEEDED FOR WHEEZING OR SHORTNESS OF BREATH 18 g 3   No facility-administered  medications prior to visit.     ROS Review of Systems  Constitutional: Negative for activity change, appetite change, chills, fatigue, fever and unexpected weight change.  Respiratory: Negative.   Cardiovascular: Negative.   Gastrointestinal: Negative.   Endocrine: Negative for polyphagia and polyuria.  Genitourinary: Negative.   Musculoskeletal: Negative.   Neurological: Negative.   Hematological: Negative.   Psychiatric/Behavioral: Negative.     Objective:  BP 136/80   Pulse 77   Ht 6\' 1"  (1.854 m)   Wt 248 lb 4 oz (112.6 kg)   SpO2 97%   BMI 32.75 kg/m   BP Readings from Last 3 Encounters:  02/04/18 136/80  01/28/18 138/82  06/14/17 140/90    Wt Readings from Last 3 Encounters:  02/04/18 248 lb 4 oz (112.6 kg)  01/28/18 245 lb 6 oz (111.3 kg)  08/18/13 203 lb (92.1 kg)    Physical Exam  Constitutional: He is oriented to person, place, and time. He appears well-developed and well-nourished. No distress.  HENT:  Head: Normocephalic and atraumatic.  Right Ear: External ear normal.  Left Ear: External ear normal.  Eyes: Right eye exhibits no discharge. No scleral icterus.  Pulmonary/Chest: Effort normal.  Neurological: He is alert and oriented to person, place, and time.  Skin: Skin is warm and dry. He is not diaphoretic.  Psychiatric: He has a normal mood and affect. His  behavior is normal. Thought content normal.    Lab Results  Component Value Date   WBC 8.4 01/28/2018   HGB 14.9 01/28/2018   HCT 43.1 01/28/2018   PLT 280.0 01/28/2018   GLUCOSE 96 01/28/2018   CHOL 162 01/28/2018   TRIG 237.0 (H) 01/28/2018   HDL 30.70 (L) 01/28/2018   LDLDIRECT 101.0 01/28/2018   LDLCALC 99 01/08/2016   ALT 62 (H) 01/28/2018   AST 23 01/28/2018   NA 139 01/28/2018   K 4.1 01/28/2018   CL 105 01/28/2018   CREATININE 0.82 01/28/2018   BUN 11 01/28/2018   CO2 24 01/28/2018   TSH 1.13 01/28/2018    US Abdomen Limited Ruq  Result Date: 02/03/2018 CLINICAL DATA:   Elevated liver function studies EXAM: ULTRASOUND ABDOMEN LIMITED RIGHT UPPER QUADRANT COMPARISON:  None. FINDINGS: Gallbladder: No gallstones or wall thickening visualized. No sonographic Murphy sign noted by sonographer. Common bile duct: Diameter: 3.5 mm Liver: The hepatic echotexture is increased diffusely and is somewhat heterogeneous. The surface contour of the liver is irregular. There is no discrete mass or ductal dilation. No ascites is observed. Portal vein is patent on color Doppler imaging with normal direction of blood flow towards the liver. IMPRESSION: Heterogeneously increased hepatic echotexture with surface contour irregularity worrisome for cirrhosis. No suspicious hepatic masses are observed. Correlation with clinical and laboratory values is needed. Normal appearing gallbladder and common bile duct. Electronically Signed   By: David  [place] M.D.   On: 02/03/2018 08:45    Assessment & Plan:   Kameran was seen today for follow-up.  Diagnoses and all orders for this visit:  Elevated liver enzymes -     Hepatic function panel -     Ceruloplasmin -     Mitochondrial Antibodies -     Iron, TIBC and Ferritin Panel  Abnormal liver ultrasound -     [place] ELASTOGRAPHY LIVER; Future -     Hepatic function panel -     Ceruloplasmin -     Mitochondrial Antibodies -     Iron, TIBC and Ferritin Panel  Class 1 obesity due to excess calories with body mass index (BMI) of 32.0 to 32.9 in adult, unspecified whether serious comorbidity present  Elevated triglycerides with high cholesterol   I am having Jeffrey Carrillo maintain his VENTOLIN HFA, pantoprazole, BREO ELLIPTA, lisinopril, fexofenadine, Fish Oil, and predniSONE.  No orders of the defined types were placed in this encounter.    Follow-up: Return in about 1 month (around 03/06/2018).  Mliss Sax, MD

## 2018-02-05 LAB — IRON,TIBC AND FERRITIN PANEL
%SAT: 29 % (ref 15–60)
Ferritin: 207 ng/mL (ref 20–345)
Iron: 101 ug/dL (ref 50–180)
TIBC: 343 ug/dL (ref 250–425)

## 2018-02-05 LAB — HEPATITIS C ANTIBODY
HEP C AB: NONREACTIVE
SIGNAL TO CUT-OFF: 0.01 (ref <1.00)

## 2018-02-05 LAB — CERULOPLASMIN: Ceruloplasmin: 25 mg/dL (ref 18–36)

## 2018-02-05 LAB — MITOCHONDRIAL ANTIBODIES

## 2018-02-10 ENCOUNTER — Ambulatory Visit: Payer: 59 | Admitting: Family Medicine

## 2018-02-10 ENCOUNTER — Encounter: Payer: Self-pay | Admitting: Family Medicine

## 2018-02-10 ENCOUNTER — Telehealth: Payer: Self-pay | Admitting: Family Medicine

## 2018-02-10 ENCOUNTER — Ambulatory Visit (INDEPENDENT_AMBULATORY_CARE_PROVIDER_SITE_OTHER): Payer: 59

## 2018-02-10 VITALS — BP 126/78 | HR 98 | Ht 73.0 in | Wt 246.0 lb

## 2018-02-10 DIAGNOSIS — M25562 Pain in left knee: Secondary | ICD-10-CM | POA: Diagnosis not present

## 2018-02-10 DIAGNOSIS — R7989 Other specified abnormal findings of blood chemistry: Secondary | ICD-10-CM

## 2018-02-10 DIAGNOSIS — M25561 Pain in right knee: Secondary | ICD-10-CM

## 2018-02-10 DIAGNOSIS — R932 Abnormal findings on diagnostic imaging of liver and biliary tract: Secondary | ICD-10-CM

## 2018-02-10 DIAGNOSIS — R945 Abnormal results of liver function studies: Secondary | ICD-10-CM

## 2018-02-10 MED ORDER — NAPROXEN-ESOMEPRAZOLE 500-20 MG PO TBEC: 1.0000 | DELAYED_RELEASE_TABLET | Freq: Two times a day (BID) | ORAL | 3 refills | Status: DC

## 2018-02-10 NOTE — Telephone Encounter (Signed)
Copied from CRM [id number]. Topic: Quick Communication - See Telephone Encounter >> Feb 10, 2018  3:29 PM [name], [name], NT wrote: CRM for notification. See Telephone encounter for: 02/10/18. Pt states that the hospital called him and his insurance will not cover the US Elastography Liver that is scheduled for tomorrow and will be $600.00. Pt would like to see if another option was available to him or if he needs to continue with this test.

## 2018-02-10 NOTE — Telephone Encounter (Signed)
I left patient a detailed voicemail and sent him a [employer] message. Dr. Doreene BurkeKremer does not want to move forward with the ultrasound due to the cost. Dr. Doreene BurkeKremer said the next step is a GI referral. Awaiting patient's response before placing referral.
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
## 2018-02-10 NOTE — Patient Instructions (Addendum)
Nice to meet you  Please try the exercises  Please try the medication when it flares up  Please follow up in 4 weeks if your pain doesn't improve.

## 2018-02-10 NOTE — Assessment & Plan Note (Signed)
Acute on chronic in nature. No structural changes on US. Likely has component of patellofemoral syndrome -Counseled on home exercise therapy -Vimovo -If no improvement consider physical therapy and imaging

## 2018-02-11 ENCOUNTER — Encounter: Payer: Self-pay | Admitting: Internal Medicine

## 2018-02-11 ENCOUNTER — Ambulatory Visit (HOSPITAL_COMMUNITY): Payer: 59

## 2018-02-11 NOTE — Telephone Encounter (Signed)
I received a message back from patient stating that it is okay to proceed with the GI referral. The referral has been placed.

## 2018-03-04 ENCOUNTER — Other Ambulatory Visit (INDEPENDENT_AMBULATORY_CARE_PROVIDER_SITE_OTHER): Payer: 59

## 2018-03-04 ENCOUNTER — Encounter: Payer: Self-pay | Admitting: Internal Medicine

## 2018-03-04 ENCOUNTER — Ambulatory Visit: Payer: 59 | Admitting: Internal Medicine

## 2018-03-04 VITALS — BP 122/80 | HR 88 | Ht 72.75 in | Wt 250.4 lb

## 2018-03-04 DIAGNOSIS — K760 Fatty (change of) liver, not elsewhere classified: Secondary | ICD-10-CM

## 2018-03-04 DIAGNOSIS — R935 Abnormal findings on diagnostic imaging of other abdominal regions, including retroperitoneum: Secondary | ICD-10-CM | POA: Diagnosis not present

## 2018-03-04 DIAGNOSIS — R945 Abnormal results of liver function studies: Secondary | ICD-10-CM

## 2018-03-04 DIAGNOSIS — R7989 Other specified abnormal findings of blood chemistry: Secondary | ICD-10-CM

## 2018-03-04 LAB — IGA: IgA: 195 mg/dL (ref 68–378)

## 2018-03-04 NOTE — Progress Notes (Signed)
HISTORY OF PRESENT ILLNESS:  Jeffrey Carrillo is a 37 y.o. male, Financial controllerhardware technician for IT with [employer], who is sent today by his primary care provider Dr. Doreene BurkeKremer regarding elevated hepatic transaminases. The patient was first made aware of liver test abnormalities approximately 5 years ago. At that time AST 51 and ALT 114. Normal alkaline phosphatase, bilirubin, protein, and albumin. Over the past 2 years he has had isolated elevation of ALT less than 100. Other liver tests normal. Patient denies a personal or family history of liver disease. No transfusions, tattoos, or IV drug use. No significant NSAID, Tylenol, or alcohol use. He is undergone extensive workup assessing for viral and nonviral causes of chronic elevation of liver tests which have been normal or negative. He did undergo an abdominal ultrasound 02/03/2018. This revealed abnormal hepatic echotexture and suggested surface contour of the liver being irregular. This raised concern for cirrhosis. Elastography was scheduled but due to very high co-pay this has been canceled. No other abnormalities of the right quadrant noted.patient has been chronically overweight. Baseline weight typically between 230 and 250 pounds. Currently with BMI of 33.26. He denies over-the-counter or nonprescription agents other than those listed. CBC is normal including MCV and platelets of 280,000.  REVIEW OF SYSTEMS:  All non-GI ROS negative unless otherwise stated in the history of present illness except for allergies  Past Medical History:  Diagnosis Date  . Allergy   . Asthma   . GERD (gastroesophageal reflux disease)   . HTN (hypertension)     Past Surgical History:  Procedure Laterality Date  . NO PAST SURGERIES      Social History Jeffrey Donhomas Perri  reports that he quit smoking about 9 months ago. He started smoking about 5 years ago. He has never used smokeless tobacco. He reports that he does not drink alcohol or use drugs.  family  history includes Depression in his maternal grandmother; Diabetes in his father; Hearing loss in his father; Heart attack in his father and maternal grandfather; Hypertension in his mother; Kidney disease in his father; Lung cancer in his paternal grandfather; Stroke in his maternal grandmother.  Allergies  Allergen Reactions  . Codeine Nausea And Vomiting       PHYSICAL EXAMINATION: Vital signs: BP 122/80 (BP Location: Left Arm, Patient Position: Sitting, Cuff Size: Normal)   Pulse 88   Ht 6' 0.75" (1.848 m) Comment: height measured without shoes  Wt 250 lb 6 oz (113.6 kg)   BMI 33.26 kg/m   Constitutional: generally well-appearing, no acute distress Psychiatric: alert and oriented x3, cooperative Eyes: extraocular movements intact, anicteric, conjunctiva pink Mouth: oral pharynx moist, no lesions Neck: supple no lymphadenopathy Cardiovascular: heart regular rate and rhythm, no murmur Lungs: clear to auscultation bilaterally Abdomen: soft,obese, nontender, nondistended, no obvious ascites, no peritoneal signs, normal bowel sounds, no organomegaly Rectal:omitted Extremities: no clubbing, cyanosis, or lower extremity edema bilaterally Skin: no lesions on visible extremities Neuro: No focal deficits. Cranial nerves intact. No asterixis.   ASSESSMENT:  #1. Chronic mild elevation of hepatic transaminases, most recently ALT. Negative workup to date. Suspect fatty liver. No evidence for advanced or decompensated liver disease. #2. Obesity  PLAN:  #1. Exercise and weight loss #2. Check celiac panel #3. Routine office follow-up in 6 months. Check labs at that time #4. No indication for additional advanced imaging or endoscopy at this time  A copy of this consultation note has been sent to Dr. Doreene BurkeKremer

## 2018-03-04 NOTE — Patient Instructions (Signed)
Your provider has requested that you go to the basement level for lab work before leaving today. Press "B" on the elevator. The lab is located at the first door on the left as you exit the elevator.  

## 2018-03-05 ENCOUNTER — Telehealth: Payer: Self-pay | Admitting: Family Medicine

## 2018-03-05 LAB — TISSUE TRANSGLUTAMINASE, IGA: (TTG) AB, IGA: 1 U/mL

## 2018-03-05 NOTE — Telephone Encounter (Signed)
Copied from CRM [id number]. Topic: Quick Communication - See Telephone Encounter >> Mar 05, 2018  5:34 PM Lorrine KinMcGee, [place] Kievit B, NT wrote: CRM for notification. See Telephone encounter for: 03/05/18. Gershon CullPriscilla with [facility] calling and is needing a diagnosis and to know what other medications the patient has tried before.  CB#: [id number]

## 2018-03-07 ENCOUNTER — Encounter: Payer: Self-pay | Admitting: Family Medicine

## 2018-03-07 ENCOUNTER — Ambulatory Visit: Payer: 59 | Admitting: Family Medicine

## 2018-03-07 VITALS — BP 136/90 | HR 83 | Temp 98.2°F | Ht 72.75 in | Wt 250.0 lb

## 2018-03-07 DIAGNOSIS — E6609 Other obesity due to excess calories: Secondary | ICD-10-CM | POA: Diagnosis not present

## 2018-03-07 DIAGNOSIS — Z6832 Body mass index (BMI) 32.0-32.9, adult: Secondary | ICD-10-CM

## 2018-03-07 NOTE — Patient Instructions (Signed)
Exercising to Lose Weight Exercising can help you to lose weight. In order to lose weight through exercise, you need to do vigorous-intensity exercise. You can tell that you are exercising with vigorous intensity if you are breathing very hard and fast and cannot hold a conversation while exercising. Moderate-intensity exercise helps to maintain your current weight. You can tell that you are exercising at a moderate level if you have a higher heart rate and faster breathing, but you are still able to hold a conversation. How often should I exercise? Choose an activity that you enjoy and set realistic goals. Your health care provider can help you to make an activity plan that works for you. Exercise regularly as directed by your health care provider. This may include:  Doing resistance training twice each week, such as: ? Push-ups. ? Sit-ups. ? Lifting weights. ? Using resistance bands.  Doing a given intensity of exercise for a given amount of time. Choose from these options: ? 150 minutes of moderate-intensity exercise every week. ? 75 minutes of vigorous-intensity exercise every week. ? A mix of moderate-intensity and vigorous-intensity exercise every week.  Children, pregnant women, people who are out of shape, people who are overweight, and older adults may need to consult a health care provider for individual recommendations. If you have any sort of medical condition, be sure to consult your health care provider before starting a new exercise program. What are some activities that can help me to lose weight?  Walking at a rate of at least 4.5 miles an hour.  Jogging or running at a rate of 5 miles per hour.  Biking at a rate of at least 10 miles per hour.  Lap swimming.  Roller-skating or in-line skating.  Cross-country skiing.  Vigorous competitive sports, such as football, basketball, and soccer.  Jumping rope.  Aerobic dancing. How can I be more active in my day-to-day  activities?  Use the stairs instead of the elevator.  Take a walk during your lunch break.  If you drive, park your car farther away from work or school.  If you take public transportation, get off one stop early and walk the rest of the way.  Make all of your phone calls while standing up and walking around.  Get up, stretch, and walk around every 30 minutes throughout the day. What guidelines should I follow while exercising?  Do not exercise so much that you hurt yourself, feel dizzy, or get very short of breath.  Consult your health care provider prior to starting a new exercise program.  Wear comfortable clothes and shoes with good support.  Drink plenty of water while you exercise to prevent dehydration or heat stroke. Body water is lost during exercise and must be replaced.  Work out until you breathe faster and your heart beats faster. This information is not intended to replace advice given to you by your health care provider. Make sure you discuss any questions you have with your health care provider. Document Released: 09/22/2010 Document Revised: 01/26/2016 Document Reviewed: 01/21/2014 Elsevier Interactive Patient Education  2018 [employer]  

## 2018-03-07 NOTE — Progress Notes (Signed)
Subjective:  Patient ID: Jeffrey Carrillo, male    DOB: 1981-07-02  Age: 37 y.o. MRN: [id number]  CC: Follow-up   HPI Jeffrey Donhomas Mailloux presents for follow-up of his obesity and elevated liver enzymes.  Through my evaluation and GIs evaluation it would seem that patient's elevated liver enzymes are mostly due to his body habitus.  He realizes that weight loss is important.  He has been his light is 180 pounds in the past.  He is not interested in joining a gym at this time because he knows that he will not go.  He has been a member of [employer] in the past and is not a fan of lifting weights.  He has cut down on the amount of fast food that he is consuming.  He is exercising by swimming and his mother-in-law's pool.  Overhead lighting is very bright in his workstation.  Work is requiring a note from his doctor to request installation of filters.     Outpatient Medications Prior to Visit  Medication Sig Dispense Refill  . BREO ELLIPTA 200-25 MCG/INH AEPB Inhale 1 puff daily.  0  . fexofenadine (ALLEGRA) 180 MG tablet Take 180 mg by mouth daily.    Marland Kitchen. lisinopril (PRINIVIL,ZESTRIL) 10 MG tablet Take 1 tablet by mouth daily.  3  . Naproxen-Esomeprazole 500-20 MG TBEC Take 1 tablet by mouth 2 (two) times daily. 60 tablet 3  . Omega-3 Fatty Acids (FISH OIL) 1200 MG CAPS Take 1 capsule by mouth daily.    . pantoprazole (PROTONIX) 40 MG tablet Take 1 tablet (40 mg total) by mouth daily. PATIENT NEEDS OFFICE VISIT FOR ADDITIONAL REFILLS 30 tablet 0  . VENTOLIN HFA 108 (90 BASE) MCG/ACT inhaler INHALE 2 PUFFS INTO THE LUNGS EVERY 6 HOURS AS NEEDED FOR WHEEZING OR SHORTNESS OF BREATH 18 g 3   No facility-administered medications prior to visit.     ROS Review of Systems  Constitutional: Negative.   Respiratory: Negative.   Cardiovascular: Negative.   Gastrointestinal: Negative.   Psychiatric/Behavioral: Negative.     Objective:  BP 136/90   Pulse 83   Temp 98.2 F (36.8 C)   Ht 6' 0.75"  (1.848 m)   Wt 250 lb (113.4 kg)   SpO2 94%   BMI 33.21 kg/m   BP Readings from Last 3 Encounters:  03/07/18 136/90  03/04/18 122/80  02/10/18 126/78    Wt Readings from Last 3 Encounters:  03/07/18 250 lb (113.4 kg)  03/04/18 250 lb 6 oz (113.6 kg)  02/10/18 246 lb (111.6 kg)    Physical Exam  Constitutional: He is oriented to person, place, and time. He appears well-developed and well-nourished. No distress.  HENT:  Head: Normocephalic and atraumatic.  Right Ear: External ear normal.  Left Ear: External ear normal.  Pulmonary/Chest: Effort normal.  Neurological: He is alert and oriented to person, place, and time.  Skin: He is not diaphoretic.  Psychiatric: He has a normal mood and affect. His behavior is normal.    Lab Results  Component Value Date   WBC 8.4 01/28/2018   HGB 14.9 01/28/2018   HCT 43.1 01/28/2018   PLT 280.0 01/28/2018   GLUCOSE 96 01/28/2018   CHOL 162 01/28/2018   TRIG 237.0 (H) 01/28/2018   HDL 30.70 (L) 01/28/2018   LDLDIRECT 101.0 01/28/2018   LDLCALC 99 01/08/2016   ALT 79 (H) 02/04/2018   AST 23 02/04/2018   NA 139 01/28/2018   K 4.1 01/28/2018   CL  105 01/28/2018   CREATININE 0.82 01/28/2018   BUN 11 01/28/2018   CO2 24 01/28/2018   TSH 1.13 01/28/2018    US Abdomen Limited Ruq  Result Date: 02/03/2018 CLINICAL DATA:  Elevated liver function studies EXAM: ULTRASOUND ABDOMEN LIMITED RIGHT UPPER QUADRANT COMPARISON:  None. FINDINGS: Gallbladder: No gallstones or wall thickening visualized. No sonographic Murphy sign noted by sonographer. Common bile duct: Diameter: 3.5 mm Liver: The hepatic echotexture is increased diffusely and is somewhat heterogeneous. The surface contour of the liver is irregular. There is no discrete mass or ductal dilation. No ascites is observed. Portal vein is patent on color Doppler imaging with normal direction of blood flow towards the liver. IMPRESSION: Heterogeneously increased hepatic echotexture with  surface contour irregularity worrisome for cirrhosis. No suspicious hepatic masses are observed. Correlation with clinical and laboratory values is needed. Normal appearing gallbladder and common bile duct. Electronically Signed   By: David  [place] M.D.   On: 02/03/2018 08:45    Assessment & Plan:   Leavy was seen today for follow-up.  Diagnoses and all orders for this visit:  Class 1 obesity due to excess calories with body mass index (BMI) of 32.0 to 32.9 in adult, unspecified whether serious comorbidity present -     Amb ref to Medical Nutrition Therapy-MNT   I am having Maisie Fus Dockendorf maintain his VENTOLIN HFA, pantoprazole, BREO ELLIPTA, lisinopril, fexofenadine, Fish Oil, and Naproxen-Esomeprazole.  No orders of the defined types were placed in this encounter.  We discussed multiple options for weight loss to include nutrition, exercise, medical therapy and surgery.  Stressed the importance of year-round physical activity.  Patient will consider a [employer] membership in order to be able to swim yearly.  Suggested that he forego any weightbearing aerobic activity until weight loss achieved.  He agrees to go for nutrition counseling.  Follow-up: Return in about 6 months (around 09/07/2018).  Mliss Sax, MD

## 2018-03-12 ENCOUNTER — Ambulatory Visit: Payer: 59 | Admitting: Family Medicine

## 2018-03-12 NOTE — Telephone Encounter (Signed)
Dx code given-will get RX sent

## 2018-03-14 ENCOUNTER — Encounter: Payer: Self-pay | Admitting: Family Medicine

## 2018-03-18 ENCOUNTER — Ambulatory Visit: Payer: Self-pay

## 2018-03-18 ENCOUNTER — Ambulatory Visit: Payer: 59 | Admitting: Family Medicine

## 2018-03-18 VITALS — BP 124/72 | HR 79 | Temp 98.4°F | Ht 73.0 in | Wt 248.6 lb

## 2018-03-18 DIAGNOSIS — M25562 Pain in left knee: Secondary | ICD-10-CM | POA: Diagnosis not present

## 2018-03-18 DIAGNOSIS — M25561 Pain in right knee: Secondary | ICD-10-CM

## 2018-03-18 DIAGNOSIS — G8929 Other chronic pain: Secondary | ICD-10-CM | POA: Diagnosis not present

## 2018-03-18 MED ORDER — DICLOFENAC SODIUM 2 % TD SOLN: 1.0000 "application " | Freq: Two times a day (BID) | TRANSDERMAL | 3 refills | Status: DC
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
## 2018-03-18 NOTE — Patient Instructions (Signed)
Good to see you  Please try the physical therapy for at least a week  Please follow up in 4-6 weeks if the pain doesn't seem to be improving.

## 2018-04-01 ENCOUNTER — Other Ambulatory Visit: Payer: Self-pay

## 2018-04-01 ENCOUNTER — Ambulatory Visit: Payer: 59 | Attending: Family Medicine | Admitting: Physical Therapy

## 2018-04-01 ENCOUNTER — Encounter: Payer: Self-pay | Admitting: Physical Therapy

## 2018-04-01 DIAGNOSIS — M25561 Pain in right knee: Secondary | ICD-10-CM

## 2018-04-01 DIAGNOSIS — M25562 Pain in left knee: Secondary | ICD-10-CM | POA: Diagnosis not present

## 2018-04-01 DIAGNOSIS — R262 Difficulty in walking, not elsewhere classified: Secondary | ICD-10-CM | POA: Diagnosis not present
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
## 2018-04-01 NOTE — Patient Instructions (Signed)
Access Code: [email or web address]  URL: [email or web address]  Date: 04/01/2018  Prepared by: Stacie GlazeMichael Bladimir Auman   Exercises  Seated Hamstring Stretch with Chair - 4 reps - 1 sets - 30 hold - 2x daily - 7x weekly  Standing Gastroc Stretch on Foam 1/2 Roll - 3 reps - 1 sets - 30 hold - 2x daily - 7x weekly  Supine ITB Stretch with Strap - 4 reps - 1 sets - 30 hold - 2x daily - 7x weekly  Supine Quad Set - 10 reps - 2 sets - 3 hold - 2x daily - 7x weekly

## 2018-04-08 ENCOUNTER — Encounter: Payer: Self-pay | Admitting: Physical Therapy

## 2018-04-08 ENCOUNTER — Ambulatory Visit: Payer: 59 | Attending: Family Medicine | Admitting: Physical Therapy

## 2018-04-08 DIAGNOSIS — R262 Difficulty in walking, not elsewhere classified: Secondary | ICD-10-CM | POA: Diagnosis not present

## 2018-04-08 DIAGNOSIS — M25561 Pain in right knee: Secondary | ICD-10-CM | POA: Diagnosis not present

## 2018-04-08 DIAGNOSIS — M25562 Pain in left knee: Secondary | ICD-10-CM | POA: Insufficient documentation

## 2018-04-08 NOTE — Therapy (Signed)
[facility] [address] [place], [place], [address] Phone: [phone]   Fax:  [phone]  Physical Therapy Treatment  Patient Details  Name: Jeffrey Carrillo MRN: [id number] Date of Birth: 1981-04-07 Referring Provider: Jordan LikesSchmitz   Encounter Date: 04/08/2018  PT End of Session - 04/08/18 0924    Visit Number  2    Date for PT Re-Evaluation  06/02/18    PT Start Time  0845    PT Stop Time  0925    PT Time Calculation (min)  40 min    Activity Tolerance  Patient tolerated treatment well    Behavior During Therapy  [facility] for tasks assessed/performed       Past Medical History:  Diagnosis Date  . Allergy   . Asthma   . GERD (gastroesophageal reflux disease)   . HTN (hypertension)     Past Surgical History:  Procedure Laterality Date  . NO PAST SURGERIES      There were no vitals filed for this visit.  Subjective Assessment - 04/08/18 0846    Subjective  Pt reports no change since evaluation    Currently in Pain?  No/denies    Pain Score  0-No pain                       [facility] Adult PT Treatment/Exercise - 04/08/18 0001      Exercises   Exercises  Knee/Hip      Knee/Hip Exercises: Stretches   Passive Hamstring Stretch  Both;4 reps;10 seconds;20 seconds    ITB Stretch  Both;3 reps;10 seconds      Knee/Hip Exercises: Machines for Strengthening   Cybex Knee Extension  15lb 2x10    Cybex Knee Flexion  35lb 2x15      Knee/Hip Exercises: Seated   Sit to Sand  2 sets;10 reps ball squeeze       Knee/Hip Exercises: Supine   Bridges with Ball Squeeze  2 sets;10 reps    Straight Leg Raises  2 sets;10 reps;Both;Left    Straight Leg Raise with External Rotation  Both;2 sets;10 reps               PT Short Term Goals - 04/08/18 [id number]      PT SHORT TERM GOAL #1   Title  independent with initial HEP    Status  Achieved        PT Long Term Goals - 04/01/18 0916      PT LONG TERM  GOAL #1   Title  report pain 50% less with stairs    Time  8    Period  Weeks    Status  New      PT LONG TERM GOAL #2   Title  report able to squat without pain >5/10    Time  8    Period  Weeks    Status  New      PT LONG TERM GOAL #3   Title  increase HS flexibility to 70 degrees SLR    Time  8    Period  Weeks    Status  New            Plan - 04/08/18 [id number]    Clinical Impression Statement  Pt tolerated an initial progression to exercises well. He is deconditioned and fatigues quick with activity. Demo ed LE muscle fatigue with supine exercises. Reports some discomfort with seated knee extension. Pt does report  some knee pain with sit to stand with ball squeeze. Bilat HS tightness noted  with passive stretching.    PT Frequency  2x / week    PT Duration  8 weeks    PT Treatment/Interventions  ADLs/Self Care Home Management;Cryotherapy;Electrical Stimulation;Iontophoresis 4mg /ml Dexamethasone;Functional mobility training;Therapeutic activities;Therapeutic exercise;Neuromuscular re-education;Manual techniques;Patient/family education    PT Next Visit Plan  assess TX  advance exercises    PT Home Exercise Plan  SLR with ER        Patient will benefit from skilled therapeutic intervention in order to improve the following deficits and impairments:  Abnormal gait, Decreased range of motion, Difficulty walking, Pain, Impaired flexibility, Decreased strength  Visit Diagnosis: Acute pain of right knee  Acute pain of left knee  Difficulty in walking, not elsewhere classified     Problem List Patient Active Problem List   Diagnosis Date Noted  . Elevated liver enzymes 02/04/2018  . Abnormal liver ultrasound 02/04/2018  . Class 1 obesity due to excess calories with body mass index (BMI) of 32.0 to 32.9 in adult 02/04/2018  . Elevated triglycerides with high cholesterol 02/04/2018  . Elevated LFTs 01/31/2018  . Encounter for health maintenance examination with abnormal  findings 01/28/2018  . Viral syndrome 01/28/2018  . Reactive airway disease 01/28/2018  . Essential hypertension 01/28/2018  . Chronic knee pain 01/28/2018  . Esophageal reflux 08/18/2013    Jeffrey Carrillo, PTA 04/08/2018, 9:28 AM  [facility] [address] [place], [place], [address] Phone: [phone]   Fax:  [phone]  Name: Jeffrey Carrillo MRN: [id number] Date of Birth: [birth year]

## 2018-04-14 ENCOUNTER — Ambulatory Visit: Payer: 59 | Admitting: Internal Medicine

## 2018-04-15 ENCOUNTER — Ambulatory Visit: Payer: 59 | Admitting: Family Medicine

## 2018-04-29 ENCOUNTER — Encounter: Payer: Self-pay | Admitting: Physical Therapy

## 2018-04-29 ENCOUNTER — Ambulatory Visit: Payer: 59 | Admitting: Physical Therapy

## 2018-04-29 DIAGNOSIS — M25561 Pain in right knee: Secondary | ICD-10-CM

## 2018-04-29 DIAGNOSIS — R262 Difficulty in walking, not elsewhere classified: Secondary | ICD-10-CM

## 2018-04-29 DIAGNOSIS — M25562 Pain in left knee: Secondary | ICD-10-CM

## 2018-04-29 NOTE — Therapy (Signed)
[facility] [address] [place], [place], [address] Phone: [phone]   Fax:  [phone]  Physical Therapy Treatment  Patient Details  Name: Jeffrey Carrillo MRN: [id number] Date of Birth: August 10, 1981 Referring Provider: Jordan LikesSchmitz   Encounter Date: 04/29/2018  PT End of Session - 04/29/18 0927    Visit Number  3    Date for PT Re-Evaluation  06/02/18    PT Start Time  0845    PT Stop Time  0937    PT Time Calculation (min)  52 min    Activity Tolerance  Patient tolerated treatment well    Behavior During Therapy  [facility] for tasks assessed/performed       Past Medical History:  Diagnosis Date  . Allergy   . Asthma   . GERD (gastroesophageal reflux disease)   . HTN (hypertension)     Past Surgical History:  Procedure Laterality Date  . NO PAST SURGERIES      There were no vitals filed for this visit.  Subjective Assessment - 04/29/18 0847    Subjective  "Knees are fine maybe getting a little better" Pt reports that his knees still hurt with stairs.    Currently in Pain?  No/denies    Pain Score  0-No pain                       [facility] Adult PT Treatment/Exercise - 04/29/18 0001      High Level Balance   High Level Balance Comments  SLS rebound ball toss x15 each, then on airex       Exercises   Exercises  Knee/Hip      Knee/Hip Exercises: Stretches   Passive Hamstring Stretch  Both;4 reps;10 seconds;20 seconds      Knee/Hip Exercises: Aerobic   Stationary Bike  L2 x6 min    "Feels weird, loike something moving under my knee cap"     Knee/Hip Exercises: Machines for Strengthening   Cybex Knee Extension  15lb 2x10    Cybex Knee Flexion  35lb 2x15    Cybex Leg Press  40lb 2x15       Knee/Hip Exercises: Standing   Walking with Sports Cord  50lb 4 way x3 each    Other Standing Knee Exercises  SL DL 4lb x8 each     Other Standing Knee Exercises  4in controlled descents x10   pain  4/10     Knee/Hip Exercises: Supine   Short Arc Quad Sets  Both;1 set;10 reps   3 sec hold     Modalities   Modalities  Cryotherapy      Cryotherapy   Number Minutes Cryotherapy  10 Minutes    Cryotherapy Location  Knee    Type of Cryotherapy  Ice pack               PT Short Term Goals - 04/08/18 [id number]      PT SHORT TERM GOAL #1   Title  independent with initial HEP    Status  Achieved        PT Long Term Goals - 04/29/18 [id number]      PT LONG TERM GOAL #1   Title  report pain 50% less with stairs    Status  On-going      PT LONG TERM GOAL #2   Title  report able to squat without pain >5/10    Status  On-going  PT LONG TERM GOAL #3   Title  increase HS flexibility to 70 degrees SLR    Status  On-going            Plan - 04/29/18 1610    Clinical Impression Statement  Pt able to complete all of today's exercises. He does report some knee pain with four inch controlled descents. Good control with resisted gait. No issues with open chain extensions. Discomfort reported with SAQ. Bilateral HS tightness remains.    Rehab Potential  Good    PT Frequency  2x / week    PT Duration  8 weeks    PT Treatment/Interventions  ADLs/Self Care Home Management;Cryotherapy;Electrical Stimulation;Iontophoresis 4mg /ml Dexamethasone;Functional mobility training;Therapeutic activities;Therapeutic exercise;Neuromuscular re-education;Manual techniques;Patient/family education    PT Next Visit Plan  assess TX  advance exercises       Patient will benefit from skilled therapeutic intervention in order to improve the following deficits and impairments:  Abnormal gait, Decreased range of motion, Difficulty walking, Pain, Impaired flexibility, Decreased strength  Visit Diagnosis: Acute pain of left knee  Acute pain of right knee  Difficulty in walking, not elsewhere classified     Problem List Patient Active Problem List   Diagnosis Date Noted  . Elevated liver enzymes  02/04/2018  . Abnormal liver ultrasound 02/04/2018  . Class 1 obesity due to excess calories with body mass index (BMI) of 32.0 to 32.9 in adult 02/04/2018  . Elevated triglycerides with high cholesterol 02/04/2018  . Elevated LFTs 01/31/2018  . Encounter for health maintenance examination with abnormal findings 01/28/2018  . Viral syndrome 01/28/2018  . Reactive airway disease 01/28/2018  . Essential hypertension 01/28/2018  . Chronic knee pain 01/28/2018  . Esophageal reflux 08/18/2013    Grayce Sessions, PTA 04/29/2018, 9:30 AM  [facility] [address] [place], [place], [address] Phone: [phone]   Fax:  [phone]  Name: Daniell Mancinas MRN: [id number] Date of Birth: Oct 02, 1980

## 2018-05-07 ENCOUNTER — Encounter: Payer: Self-pay | Admitting: Family Medicine

## 2018-05-07 ENCOUNTER — Ambulatory Visit: Payer: 59 | Admitting: Family Medicine

## 2018-05-07 VITALS — BP 126/82 | HR 86 | Ht 73.0 in | Wt 245.0 lb

## 2018-05-07 DIAGNOSIS — M25561 Pain in right knee: Secondary | ICD-10-CM

## 2018-05-07 DIAGNOSIS — G8929 Other chronic pain: Secondary | ICD-10-CM

## 2018-05-07 NOTE — Patient Instructions (Signed)
Good to see you   you will get a phone call about the MRI  Once the MRI is completed I will give you a call.

## 2018-05-07 NOTE — Progress Notes (Signed)
Jeffrey Carrillo - 37 y.o. male MRN [id number]  Date of birth: 10-Sep-1980  SUBJECTIVE:  Including CC & ROS.  Chief Complaint  Patient presents with  . Follow-up    Jeffrey Carrillo is a 37 y.o. male that is here today for knee pain follow up. He has been in physical therapy weekly. He had a shooting pain on the anterior aspect of his right knee. The pain is worse with activity. He notices pain with turning his knee. Pain is worse with walking up and down the stairs.      Review of Systems  Constitutional: Negative for fever.  HENT: Negative for congestion.   Respiratory: Negative for cough.   Cardiovascular: Negative for chest pain.  Gastrointestinal: Negative for abdominal pain.  Musculoskeletal: Negative for gait problem.  Skin: Negative for color change.  Neurological: Negative for weakness.  Hematological: Negative for adenopathy.  Psychiatric/Behavioral: Negative for agitation.    HISTORY: Past Medical, Surgical, Social, and Family History Reviewed & Updated per EMR.   Pertinent Historical Findings include:  Past Medical History:  Diagnosis Date  . Allergy   . Asthma   . GERD (gastroesophageal reflux disease)   . HTN (hypertension)     Past Surgical History:  Procedure Laterality Date  . NO PAST SURGERIES      Allergies  Allergen Reactions  . Codeine Nausea And Vomiting    Family History  Problem Relation Age of Onset  . Hypertension Mother   . Diabetes Father   . Hearing loss Father   . Heart attack Father   . Kidney disease Father   . Stroke Maternal Grandmother   . Depression Maternal Grandmother   . Heart attack Maternal Grandfather   . Lung cancer Paternal Grandfather      Social History   Socioeconomic History  . Marital status: Married    Spouse name: Not on file  . Number of children: 2  . Years of education: Not on file  . Highest education level: Not on file  Occupational History  . Not on file  Social Needs  . Financial resource  strain: Not on file  . Food insecurity:    Worry: Not on file    Inability: Not on file  . Transportation needs:    Medical: Not on file    Non-medical: Not on file  Tobacco Use  . Smoking status: Former Smoker    Start date: 05/08/2012    Last attempt to quit: 06/2017    Years since quitting: 0.9  . Smokeless tobacco: Never Used  Substance and Sexual Activity  . Alcohol use: No  . Drug use: No  . Sexual activity: Yes    Birth control/protection: Surgical  Lifestyle  . Physical activity:    Days per week: Not on file    Minutes per session: Not on file  . Stress: Not on file  Relationships  . Social connections:    Talks on phone: Not on file    Gets together: Not on file    Attends religious service: Not on file    Active member of club or organization: Not on file    Attends meetings of clubs or organizations: Not on file    Relationship status: Not on file  . Intimate partner violence:    Fear of current or ex partner: Not on file    Emotionally abused: Not on file    Physically abused: Not on file    Forced sexual activity: Not on file  Other Topics Concern  . Not on file  Social History Narrative  . Not on file     PHYSICAL EXAM:  VS: BP 126/82 (BP Location: Left Arm, Patient Position: Sitting, Cuff Size: Normal)   Pulse 86   Ht 6\' 1"  (1.854 m)   Wt 245 lb (111.1 kg)   SpO2 98%   BMI 32.32 kg/m  Physical Exam Gen: NAD, alert, cooperative with exam, well-appearing ENT: normal lips, normal nasal mucosa,  Eye: normal EOM, normal conjunctiva and lids CV:  no edema, +2 pedal pulses   Resp: no accessory muscle use, non-labored,   Skin: no rashes, no areas of induration  Neuro: normal tone, normal sensation to touch Psych:  normal insight, alert and oriented MSK:  Right knee:  No obvious effusion  Normal knee flexion and extension  Some pain with McMurray's test  No instability  No pain with patellar grind  Neurovascularly intact       ASSESSMENT &  PLAN:   Chronic knee pain Concern for meniscal or chondral defect. Has been in PT and pain is still ongoing  - xray  - MRI to evaluate for meniscal tear or chondral defect.

## 2018-05-07 NOTE — Assessment & Plan Note (Addendum)
Concern for meniscal or chondral defect. Has been in PT and pain is still ongoing  - xray  - MRI to evaluate for meniscal tear or chondral defect.

## 2018-05-13 ENCOUNTER — Ambulatory Visit (INDEPENDENT_AMBULATORY_CARE_PROVIDER_SITE_OTHER): Payer: 59

## 2018-05-13 ENCOUNTER — Ambulatory Visit: Payer: 59 | Attending: Family Medicine | Admitting: Physical Therapy

## 2018-05-13 ENCOUNTER — Encounter: Payer: Self-pay | Admitting: Physical Therapy

## 2018-05-13 ENCOUNTER — Other Ambulatory Visit: Payer: Self-pay | Admitting: Family Medicine

## 2018-05-13 ENCOUNTER — Other Ambulatory Visit: Payer: 59

## 2018-05-13 DIAGNOSIS — G8929 Other chronic pain: Secondary | ICD-10-CM

## 2018-05-13 DIAGNOSIS — M25561 Pain in right knee: Secondary | ICD-10-CM

## 2018-05-13 DIAGNOSIS — M25562 Pain in left knee: Secondary | ICD-10-CM

## 2018-05-13 DIAGNOSIS — R262 Difficulty in walking, not elsewhere classified: Secondary | ICD-10-CM

## 2018-05-13 NOTE — Therapy (Addendum)
[place], [place], [address] Phone: [phone]   Fax:  [phone]  Physical Therapy Treatment  Patient Details  Name: Jeffrey Carrillo MRN: [id number] Date of Birth: April 03, 1981 Referring Provider: Raeford Razor   Encounter Date: 05/13/2018  PT End of Session - 05/13/18 0927    Visit Number  4    Date for PT Re-Evaluation  06/02/18    PT Start Time  0845    PT Stop Time  0936    PT Time Calculation (min)  51 min    Activity Tolerance  Patient tolerated treatment well    Behavior During Therapy  [facility] for tasks assessed/performed       Past Medical History:  Diagnosis Date  . Allergy   . Asthma   . GERD (gastroesophageal reflux disease)   . HTN (hypertension)     Past Surgical History:  Procedure Laterality Date  . NO PAST SURGERIES      There were no vitals filed for this visit.  Subjective Assessment - 05/13/18 0846    Subjective  "About the same" Pt reports MD Wednesday and wants him to come back to have X-rays done    Pain Score  1     Pain Location  Knee    Pain Orientation  Right;Left         [facility] PT Assessment - 05/13/18 0001      Flexibility   Hamstrings  SLR 70 deg                   [facility] Adult PT Treatment/Exercise - 05/13/18 0001      Exercises   Exercises  Knee/Hip      Knee/Hip Exercises: Stretches   Passive Hamstring Stretch  Both;4 reps;10 seconds;20 seconds      Knee/Hip Exercises: Aerobic   Stationary Bike  L2 x6 min       Knee/Hip Exercises: Machines for Strengthening   Cybex Knee Extension  15lb 2x10    Cybex Knee Flexion  35lb 2x15    Cybex Leg Press  40lb 2x15       Knee/Hip Exercises: Standing   Forward Step Up  Both;1 set;10 reps;Hand Hold: 0;Step Height: 6"    Walking with Sports Cord  50lb 4 way x3 each      Knee/Hip Exercises: Supine   Short Arc Quad Sets  Both;15 reps;1 set   3 sec hold   Short Arc Quad Sets Limitations   3lb    Straight Leg Raise with External Rotation  Both;2 sets;10 reps               PT Short Term Goals - 05/13/18 0903      PT SHORT TERM GOAL #1   Title  independent with initial HEP    Status  Achieved        PT Long Term Goals - 05/13/18 0902      PT LONG TERM GOAL #1   Title  report pain 50% less with stairs    Status  On-going      PT LONG TERM GOAL #2   Title  report able to squat without pain >5/10    Status  On-going            Plan - 05/13/18 0927    Clinical Impression Statement  Pt has progressed increasing his HS flexibility. He reports no change with ambulating up slopes or with stair negotiation. Pt is  unable to attend therapy more frequently due to financial concerns. Pt was able to complete all of today's exercises. He does report increase knee pain during resisted gait with eccentric loads are on the quads. LE fatigues quick with SLR with external rotation.    Rehab Potential  Good    PT Frequency  2x / week    PT Duration  8 weeks    PT Treatment/Interventions  ADLs/Self Care Home Management;Cryotherapy;Electrical Stimulation;Iontophoresis '4mg'$ /ml Dexamethasone;Functional mobility training;Therapeutic activities;Therapeutic exercise;Neuromuscular re-education;Manual techniques;Patient/family education    PT Next Visit Plan  Pt does to MD later today for X-rays       Patient will benefit from skilled therapeutic intervention in order to improve the following deficits and impairments:  Abnormal gait, Decreased range of motion, Difficulty walking, Pain, Impaired flexibility, Decreased strength  Visit Diagnosis: Acute pain of left knee  Difficulty in walking, not elsewhere classified  Acute pain of right knee     Problem List Patient Active Problem List   Diagnosis Date Noted  . Elevated liver enzymes 02/04/2018  . Abnormal liver ultrasound 02/04/2018  . Class 1 obesity due to excess calories with body mass index (BMI) of 32.0 to 32.9 in  adult 02/04/2018  . Elevated triglycerides with high cholesterol 02/04/2018  . Elevated LFTs 01/31/2018  . Encounter for health maintenance examination with abnormal findings 01/28/2018  . Viral syndrome 01/28/2018  . Reactive airway disease 01/28/2018  . Essential hypertension 01/28/2018  . Chronic knee pain 01/28/2018  . Esophageal reflux 08/18/2013   PHYSICAL THERAPY DISCHARGE SUMMARY   Plan: Patient agrees to discharge.  Patient goals were not met. Patient is being discharged due to not returning since the last visit.  ?????      Scot Jun, PTA 05/13/2018, 9:31 AM  [address] [place], [place], [address] Phone: [phone]   Fax:  [phone]  Name: Azan Maneri MRN: [id number] Date of Birth: December 07, 1980

## 2018-05-13 NOTE — Addendum Note (Signed)
Addended by: Varney Biles on: 05/13/2018 09:46 AM   Modules accepted: Orders

## 2018-05-27 ENCOUNTER — Ambulatory Visit: Payer: 59 | Admitting: Physical Therapy

## 2018-05-29 ENCOUNTER — Inpatient Hospital Stay: Admission: RE | Admit: 2018-05-29 | Payer: 59 | Source: Ambulatory Visit

## 2018-06-02 ENCOUNTER — Other Ambulatory Visit: Payer: Self-pay | Admitting: Family Medicine

## 2018-06-02 ENCOUNTER — Telehealth: Payer: Self-pay | Admitting: Family Medicine

## 2018-06-02 NOTE — Telephone Encounter (Signed)
Copied from CRM [id number]. Topic: Quick Communication - Rx Refill/Question >> Jun 02, 2018  4:07 PM Jaquita Rector A wrote: Medication: lisinopril (PRINIVIL,ZESTRIL) 10 MG tablet   Has the patient contacted their pharmacy? Yes   Preferred Pharmacy (with phone number or street name):  [id number] - [place], [place] - [address] AT [address] [phone] (Phone) [phone] (Fax    Agent: Please be advised that RX refills may take up to 3 business days. We ask that you follow-up with your pharmacy.

## 2018-06-02 NOTE — Telephone Encounter (Signed)
Medication filled on 06/02/18

## 2018-08-26 ENCOUNTER — Encounter: Payer: Self-pay | Admitting: Family Medicine

## 2018-08-26 ENCOUNTER — Telehealth: Payer: Self-pay

## 2018-08-26 DIAGNOSIS — J111 Influenza due to unidentified influenza virus with other respiratory manifestations: Secondary | ICD-10-CM | POA: Diagnosis not present

## 2018-08-26 MED ORDER — OSELTAMIVIR PHOSPHATE 75 MG PO CAPS: 75.0000 mg | ORAL_CAPSULE | Freq: Two times a day (BID) | ORAL | 0 refills | Status: DC

## 2018-08-26 NOTE — Telephone Encounter (Signed)
Copied from CRM [phone]. Topic: General - Other >> Aug 26, 2018 12:38 PM Percival SpanishKennedy, Cheryl W wrote:  Pt said his daughter was diag with the flu and he is asking if  tamiflu can be called in for him      [address]

## 2018-10-09 ENCOUNTER — Ambulatory Visit: Payer: 59 | Admitting: Family Medicine

## 2018-10-09 ENCOUNTER — Encounter: Payer: Self-pay | Admitting: Family Medicine

## 2018-10-09 VITALS — BP 130/80 | HR 83 | Temp 97.9°F | Ht 73.0 in | Wt 208.2 lb

## 2018-10-09 DIAGNOSIS — R945 Abnormal results of liver function studies: Secondary | ICD-10-CM

## 2018-10-09 DIAGNOSIS — H1031 Unspecified acute conjunctivitis, right eye: Secondary | ICD-10-CM

## 2018-10-09 DIAGNOSIS — R7989 Other specified abnormal findings of blood chemistry: Secondary | ICD-10-CM

## 2018-10-09 LAB — HEPATIC FUNCTION PANEL
ALK PHOS: 47 U/L (ref 39–117)
ALT: 29 U/L (ref 0–53)
AST: 15 U/L (ref 0–37)
Albumin: 5 g/dL (ref 3.5–5.2)
BILIRUBIN DIRECT: 0.2 mg/dL (ref 0.0–0.3)
BILIRUBIN TOTAL: 1 mg/dL (ref 0.2–1.2)
Total Protein: 7.2 g/dL (ref 6.0–8.3)

## 2018-10-09 MED ORDER — SULFACETAMIDE SODIUM 10 % OP SOLN: 1.0000 [drp] | OPHTHALMIC | 0 refills | Status: DC

## 2018-10-09 NOTE — Patient Instructions (Signed)
Bacterial Conjunctivitis, Adult  Bacterial conjunctivitis is an infection of the clear membrane that covers the white part of your eye and the inner surface of your eyelid (conjunctiva). When the blood vessels in your conjunctiva become inflamed, your eye becomes red or pink, and it will probably feel itchy. Bacterial conjunctivitis spreads very easily from person to person (is contagious). It also spreads easily from one eye to the other eye.  What are the causes?  This condition is caused by bacteria. You may get the infection if you come into close contact with:   A person who is infected with the bacteria.   Items that are contaminated with the bacteria, such as a face towel, contact lens solution, or eye makeup.  What increases the risk?  You are more likely to develop this condition if you:   Are exposed to other people who have the infection.   Wear contact lenses.   Have a sinus infection.   Have had a recent eye injury or surgery.   Have a weak body defense system (immune system).   Have a medical condition that causes dry eyes.  What are the signs or symptoms?  Symptoms of this condition include:   Thick, yellowish discharge from the eye. This may turn into a crust on the eyelid overnight and cause your eyelids to stick together.   Tearing or watery eyes.   Itchy eyes.   Burning feeling in your eyes.   Eye redness.   Swollen eyelids.   Blurred vision.  How is this diagnosed?  This condition is diagnosed based on your symptoms and medical history. Your health care provider may also take a sample of discharge from your eye to find the cause of your infection. This is rarely done.  How is this treated?  This condition may be treated with:   Antibiotic eye drops or ointment to clear the infection more quickly and prevent the spread of infection to others.   Oral antibiotic medicines to treat infections that do not respond to drops or ointments or that last longer than 10 days.   Cool, wet  cloths (cool compresses) placed on the eyes.   Artificial tears applied 2-6 times a day.  Follow these instructions at home:  Medicines   Take or apply your antibiotic medicine as told by your health care provider. Do not stop taking or applying the antibiotic even if you start to feel better.   Take or apply over-the-counter and prescription medicines only as told by your health care provider.   Be very careful to avoid touching the edge of your eyelid with the eye-drop bottle or the ointment tube when you apply medicines to the affected eye. This will keep you from spreading the infection to your other eye or to other people.  Managing discomfort   Gently wipe away any drainage from your eye with a warm, wet washcloth or a cotton ball.   Apply a clean, cool compress to your eye for 10-20 minutes, 3-4 times a day.  General instructions   Do not wear contact lenses until the inflammation is gone and your health care provider says it is safe to wear them again. Ask your health care provider how to sterilize or replace your contact lenses before you use them again. Wear glasses until you can resume wearing contact lenses.   Avoid wearing eye makeup until the inflammation is gone. Throw away any old eye cosmetics that may be contaminated.   Change or wash   your pillowcase every day.   Do not share towels or washcloths. This may spread the infection.   Wash your hands often with soap and water. Use paper towels to dry your hands.   Avoid touching or rubbing your eyes.   Do not drive or use heavy machinery if your vision is blurred.  Contact a health care provider if:   You have a fever.   Your symptoms do not get better after 10 days.  Get help right away if you have:   A fever and your symptoms suddenly get worse.   Severe pain when you move your eye.   Facial pain, redness, or swelling.   Sudden loss of vision.  Summary   Bacterial conjunctivitis is an infection of the clear membrane that covers the  white part of your eye and the inner surface of your eyelid (conjunctiva).   Bacterial conjunctivitis spreads very easily from person to person (is contagious).   Wash your hands often with soap and water. Use paper towels to dry your hands.   Take or apply your antibiotic medicine as told by your health care provider. Do not stop taking or applying the antibiotic even if you start to feel better.   Contact a health care provider if you have a fever or your symptoms do not get better after 10 days.  This information is not intended to replace advice given to you by your health care provider. Make sure you discuss any questions you have with your health care provider.  Document Released: 08/20/2005 Document Revised: 03/26/2018 Document Reviewed: 03/26/2018  Elsevier Interactive Patient Education  2019 [employer]

## 2018-10-09 NOTE — Progress Notes (Signed)
Established Patient Office Visit  Subjective:  Patient ID: Jeffrey Carrillo, male    DOB: February 08, 1981  Age: 38 y.o. MRN: [id number]  CC:  Chief Complaint  Patient presents with  . right eye pain    HPI Thaxton Pariso presents for evaluation and treatment of peri-orbital swelling of OD with a.m. discharge.  Denies eye discomfort, light sensitivity or headache.  Patient is not a contact wearer.  There was no injury.  No recent URI symptoms.  He has lost 40 pounds with weight watchers.  Will be following up on his elevated liver enzymes.  I assured him that they should be normal today.  Past Medical History:  Diagnosis Date  . Allergy   . Asthma   . GERD (gastroesophageal reflux disease)   . HTN (hypertension)     Past Surgical History:  Procedure Laterality Date  . NO PAST SURGERIES      Family History  Problem Relation Age of Onset  . Hypertension Mother   . Diabetes Father   . Hearing loss Father   . Heart attack Father   . Kidney disease Father   . Stroke Maternal Grandmother   . Depression Maternal Grandmother   . Heart attack Maternal Grandfather   . Lung cancer Paternal Grandfather     Social History   Socioeconomic History  . Marital status: Married    Spouse name: Not on file  . Number of children: 2  . Years of education: Not on file  . Highest education level: Not on file  Occupational History  . Not on file  Social Needs  . Financial resource strain: Not on file  . Food insecurity:    Worry: Not on file    Inability: Not on file  . Transportation needs:    Medical: Not on file    Non-medical: Not on file  Tobacco Use  . Smoking status: Former Smoker    Start date: 05/08/2012    Last attempt to quit: 06/2017    Years since quitting: 1.3  . Smokeless tobacco: Never Used  Substance and Sexual Activity  . Alcohol use: No  . Drug use: No  . Sexual activity: Yes    Birth control/protection: Surgical  Lifestyle  . Physical activity:    Days  per week: Not on file    Minutes per session: Not on file  . Stress: Not on file  Relationships  . Social connections:    Talks on phone: Not on file    Gets together: Not on file    Attends religious service: Not on file    Active member of club or organization: Not on file    Attends meetings of clubs or organizations: Not on file    Relationship status: Not on file  . Intimate partner violence:    Fear of current or ex partner: Not on file    Emotionally abused: Not on file    Physically abused: Not on file    Forced sexual activity: Not on file  Other Topics Concern  . Not on file  Social History Narrative  . Not on file    Outpatient Medications Prior to Visit  Medication Sig Dispense Refill  . BREO ELLIPTA 200-25 MCG/INH AEPB Inhale 1 puff daily.  0  . lisinopril (PRINIVIL,ZESTRIL) 10 MG tablet TAKE 1 TABLET BY MOUTH EVERY DAY 90 tablet 1  . Omega-3 Fatty Acids (FISH OIL) 1200 MG CAPS Take 1 capsule by mouth daily.    . pantoprazole (  PROTONIX) 40 MG tablet Take 1 tablet (40 mg total) by mouth daily. PATIENT NEEDS OFFICE VISIT FOR ADDITIONAL REFILLS 30 tablet 0  . VENTOLIN HFA 108 (90 BASE) MCG/ACT inhaler INHALE 2 PUFFS INTO THE LUNGS EVERY 6 HOURS AS NEEDED FOR WHEEZING OR SHORTNESS OF BREATH 18 g 3  . oseltamivir (TAMIFLU) 75 MG capsule Take 1 capsule (75 mg total) by mouth 2 (two) times daily. 10 capsule 0   No facility-administered medications prior to visit.     Allergies  Allergen Reactions  . Codeine Nausea And Vomiting    ROS Review of Systems  Constitutional: Negative for diaphoresis, fatigue, fever and unexpected weight change.  HENT: Negative for congestion, postnasal drip, rhinorrhea, sinus pressure and sinus pain.   Eyes: Positive for discharge and itching. Negative for photophobia, pain, redness and visual disturbance.  Respiratory: Negative.   Cardiovascular: Negative.   Gastrointestinal: Negative.   Musculoskeletal: Negative for arthralgias and  myalgias.  Neurological: Negative for light-headedness and headaches.  Hematological: Does not bruise/bleed easily.  Psychiatric/Behavioral: Negative.       Objective:    Physical Exam  Constitutional: He is oriented to person, place, and time. He appears well-developed and well-nourished. No distress.  HENT:  Head: Normocephalic and atraumatic.  Right Ear: External ear normal.  Left Ear: External ear normal.  Mouth/Throat: Oropharynx is clear and moist. No oropharyngeal exudate.  Eyes: Pupils are equal, round, and reactive to light. EOM are normal. Right eye exhibits discharge. No foreign body present in the right eye. No foreign body present in the left eye. Right conjunctiva is injected. Right conjunctiva has no hemorrhage. Left conjunctiva is not injected. Left conjunctiva has no hemorrhage.    Neck: Neck supple. No JVD present. No tracheal deviation present. No thyromegaly present.  Pulmonary/Chest: Effort normal. No stridor.  Lymphadenopathy:    He has no cervical adenopathy.  Neurological: He is alert and oriented to person, place, and time.  Skin: Skin is warm and dry. He is not diaphoretic.  Psychiatric: He has a normal mood and affect. His behavior is normal.    BP 130/80   Pulse 83   Temp 97.9 F (36.6 C) (Oral)   Ht 6\' 1"  (1.854 m)   Wt 208 lb 4 oz (94.5 kg)   SpO2 97%   BMI 27.48 kg/m  Wt Readings from Last 3 Encounters:  10/09/18 208 lb 4 oz (94.5 kg)  05/07/18 245 lb (111.1 kg)  03/18/18 248 lb 9.6 oz (112.8 kg)   BP Readings from Last 3 Encounters:  10/09/18 130/80  05/07/18 126/82  03/18/18 124/72   Guideline developer:  UpToDate (see UpToDate for funding source) Date Released: June 2014  There are no preventive care reminders to display for this patient.  There are no preventive care reminders to display for this patient.  Lab Results  Component Value Date   TSH 1.13 01/28/2018   Lab Results  Component Value Date   WBC 8.4 01/28/2018    HGB 14.9 01/28/2018   HCT 43.1 01/28/2018   MCV 87.0 01/28/2018   PLT 280.0 01/28/2018   Lab Results  Component Value Date   NA 139 01/28/2018   K 4.1 01/28/2018   CO2 24 01/28/2018   GLUCOSE 96 01/28/2018   BUN 11 01/28/2018   CREATININE 0.82 01/28/2018   BILITOT 0.4 02/04/2018   ALKPHOS 42 02/04/2018   AST 23 02/04/2018   ALT 79 (H) 02/04/2018   PROT 6.6 02/04/2018   ALBUMIN 4.5 02/04/2018  CALCIUM 9.8 01/28/2018   GFR 112.10 01/28/2018   Lab Results  Component Value Date   CHOL 162 01/28/2018   Lab Results  Component Value Date   HDL 30.70 (L) 01/28/2018   Lab Results  Component Value Date   LDLCALC 99 01/08/2016   Lab Results  Component Value Date   TRIG 237.0 (H) 01/28/2018   Lab Results  Component Value Date   CHOLHDL 5 01/28/2018   No results found for: HGBA1C    Assessment & Plan:   Problem List Items Addressed This Visit      Other   Elevated LFTs   Relevant Orders   Hepatic function panel   Acute conjunctivitis of right eye - Primary   Relevant Medications   sulfacetamide (BLEPH-10) 10 % ophthalmic solution      Meds ordered this encounter  Medications  . sulfacetamide (BLEPH-10) 10 % ophthalmic solution    Sig: Place 1 drop into the right eye every 3 (three) hours while awake.    Dispense:  15 mL    Refill:  0    Follow-up: Return in about 4 days (around 10/13/2018), or if symptoms worsen or fail to improve.
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
## 2018-11-18 ENCOUNTER — Telehealth: Payer: Self-pay | Admitting: Family Medicine

## 2018-11-18 NOTE — Telephone Encounter (Signed)
Copied from CRM [id number]. Topic: Quick Communication - Rx Refill/Question >> Nov 18, 2018  4:23 PM Maia Petties wrote: Medication: VENTOLIN HFA 108 (90 BASE) MCG/ACT inhaler  - pt went to refill medication and insurance is denying and requesting generic - please update RX and resend for albuterol sulfate inhaler  Has the patient contacted their pharmacy? Yes - told to contact MD for generic RX or PA on ventolin- pt ok with generic Preferred Pharmacy (with phone number or street name): [id number] - [place], [place] - [address] AT [facility] [address] [phone] (Phone) [phone] (Fax)

## 2018-11-18 NOTE — Telephone Encounter (Signed)
Okay to refill? 

## 2018-11-18 NOTE — Telephone Encounter (Signed)
Okay 

## 2018-11-19 MED ORDER — ALBUTEROL SULFATE HFA 108 (90 BASE) MCG/ACT IN AERS: INHALATION_SPRAY | RESPIRATORY_TRACT | 3 refills | Status: DC

## 2018-11-19 NOTE — Telephone Encounter (Signed)
Rx sent in

## 2018-12-12 ENCOUNTER — Other Ambulatory Visit: Payer: Self-pay | Admitting: Family Medicine

## 2019-03-31 ENCOUNTER — Telehealth: Payer: Self-pay | Admitting: Family Medicine

## 2019-03-31 NOTE — Telephone Encounter (Signed)
Medication: BREO ELLIPTA 200-25 MCG/INH AEPB [id number]   Pt states that he does not have a coupon for this medication anymore. Pt would like to know if something cheaper could be called in.

## 2019-04-01 MED ORDER — FLUTICASONE-SALMETEROL 250-50 MCG/DOSE IN AEPB: 1.0000 | INHALATION_SPRAY | Freq: Two times a day (BID) | RESPIRATORY_TRACT | 2 refills | Status: DC

## 2019-04-01 NOTE — Telephone Encounter (Signed)
advair 250/4 50 1 puff q 12 2 rf.  Due for a physical

## 2019-04-01 NOTE — Telephone Encounter (Signed)
Rx sent in. Pt is aware. 

## 2019-04-17 ENCOUNTER — Telehealth: Payer: Self-pay | Admitting: Family Medicine

## 2019-04-17 NOTE — Telephone Encounter (Signed)
Tried to call pt and schedule CPE, had to leave a message.

## 2019-05-04 DIAGNOSIS — Z23 Encounter for immunization: Secondary | ICD-10-CM | POA: Diagnosis not present

## 2019-08-18 ENCOUNTER — Other Ambulatory Visit: Payer: Self-pay | Admitting: Family Medicine

## 2019-08-18 NOTE — Telephone Encounter (Signed)
Attempted to reach patient. No answer. Vm left to call back  

## 2019-08-20 NOTE — Telephone Encounter (Signed)
Sent a Pharmacist, community message requesting pt call office to schedule an OV

## 2019-09-10 ENCOUNTER — Other Ambulatory Visit: Payer: Self-pay | Admitting: Family Medicine

## 2019-09-17 IMAGING — DX DG KNEE COMPLETE 4+V*R*
4 series · 4 of 4 positions shown · non-contrast
Comparison: None.

CLINICAL DATA: 36-year-old male with right knee pain worse with
activity. Initial encounter.

EXAM:
RIGHT KNEE - COMPLETE 4+ VIEW

[knee ap]
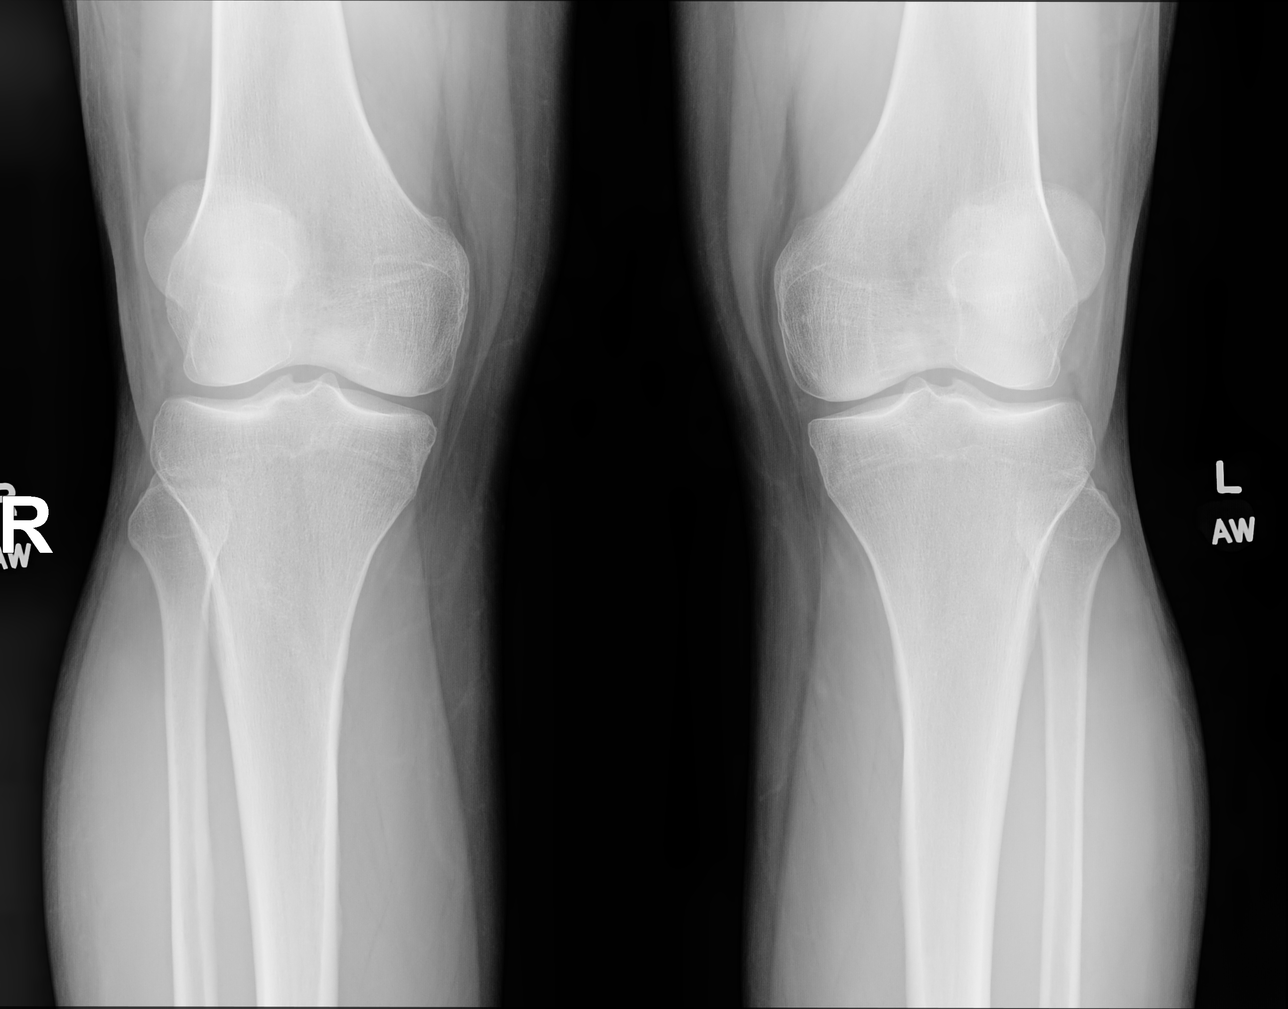

[knee [person_name] view pa]
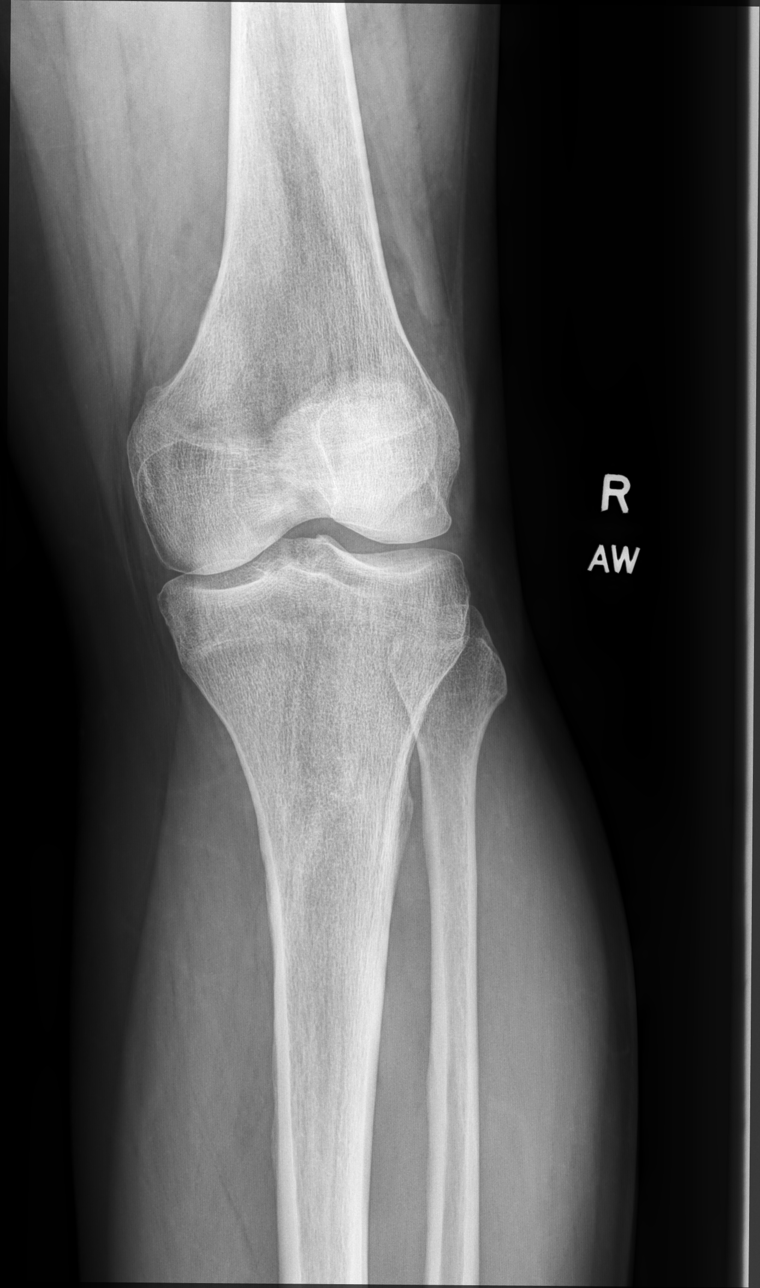

[knee lat]
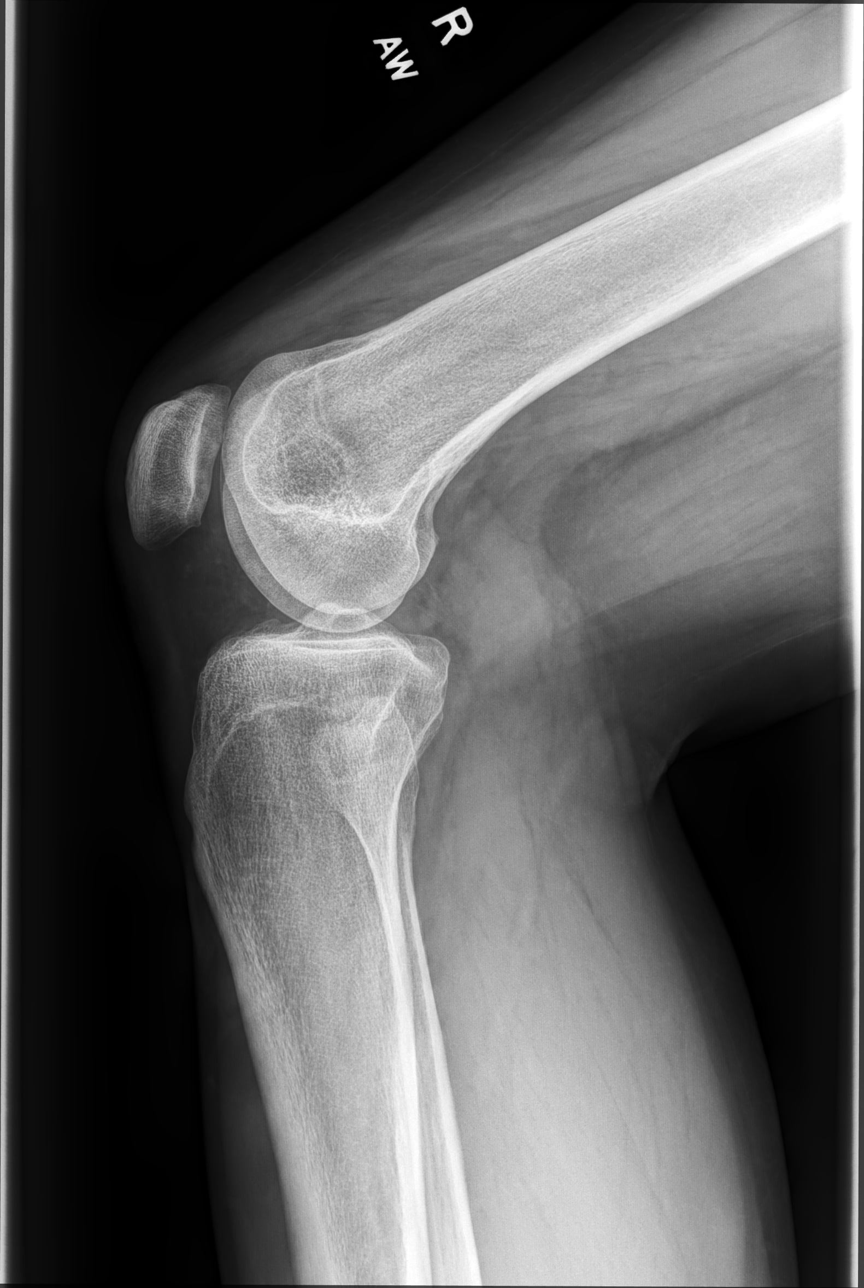

[patella (sunrise)]
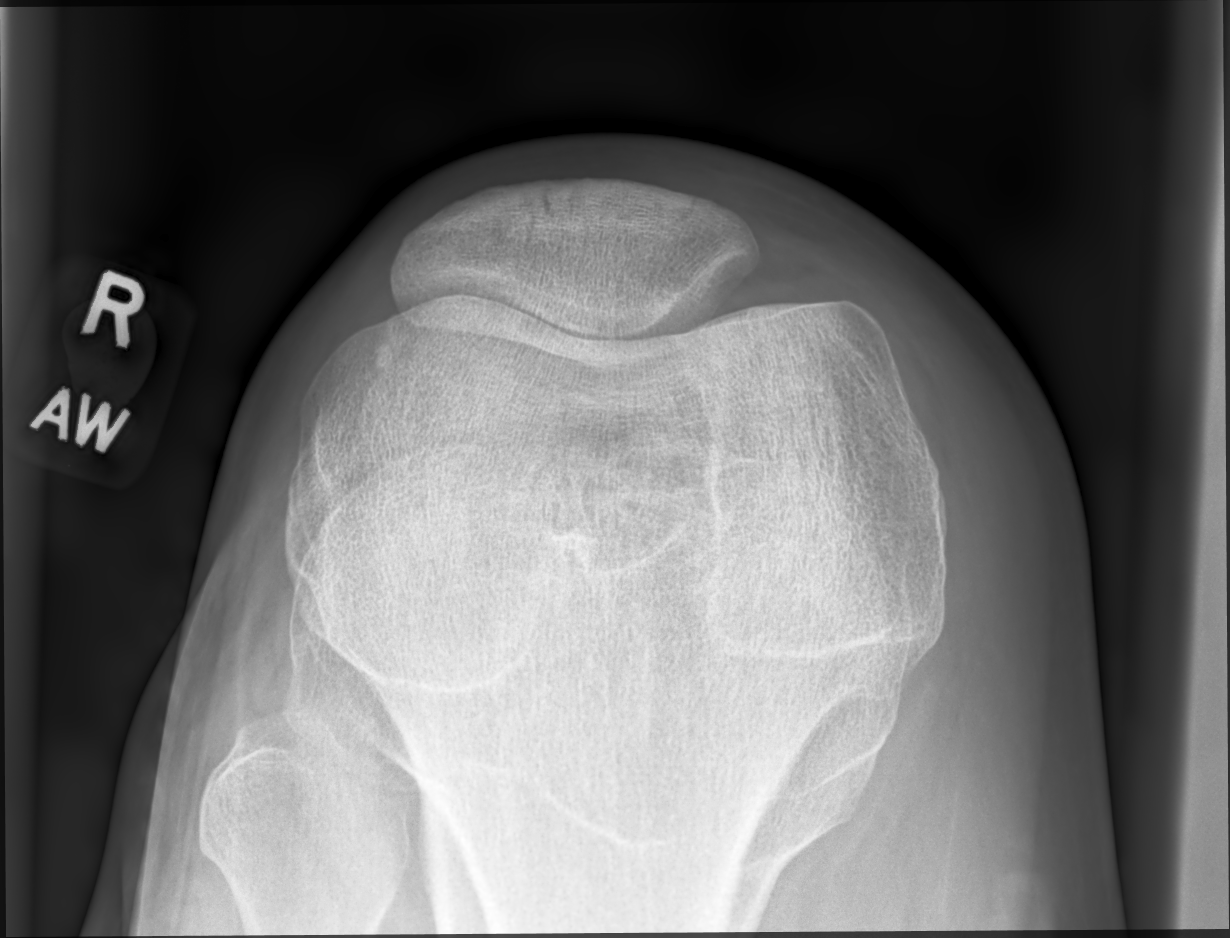

[4 of 4 positions shown; findings below may reference images not displayed]

FINDINGS: No evidence of fracture, dislocation, or joint effusion. No evidence
of arthropathy or other focal bone abnormality. Soft tissues are
unremarkable.
IMPRESSION: Negative.

## 2019-10-01 ENCOUNTER — Telehealth: Payer: Self-pay | Admitting: Family Medicine

## 2019-10-01 ENCOUNTER — Other Ambulatory Visit: Payer: Self-pay | Admitting: Family Medicine

## 2019-10-01 NOTE — Telephone Encounter (Signed)
Just sent a prescription for this medicine back in mid December. He is overusing it and or his asthma is not well controlled. He needs to be seen.

## 2019-10-01 NOTE — Telephone Encounter (Signed)
Patient is calling and is requesting a refill for an Albuterol Inhaler sent to [facility] on [address] in [place]. CB is [phone]

## 2019-10-01 NOTE — Telephone Encounter (Signed)
Okay to refill requested medication? Scheduled pt to come in for Physical in a few weeks.

## 2019-10-02 ENCOUNTER — Encounter: Payer: Self-pay | Admitting: Family Medicine

## 2019-10-02 NOTE — Telephone Encounter (Signed)
Spoke with patient tried to get him to come in earlier he did agree to come in on the 4th instead of the 16th of Feb> I let pt know that it sounds like he is using his inhaler to much and his asthma is not controled well with this issue.

## 2019-10-08 ENCOUNTER — Other Ambulatory Visit: Payer: Self-pay

## 2019-10-08 ENCOUNTER — Encounter: Payer: Self-pay | Admitting: Family Medicine

## 2019-10-08 ENCOUNTER — Ambulatory Visit (INDEPENDENT_AMBULATORY_CARE_PROVIDER_SITE_OTHER): Payer: 59 | Admitting: Family Medicine

## 2019-10-08 VITALS — BP 122/70 | HR 84 | Temp 97.6°F | Ht 73.0 in | Wt 221.6 lb

## 2019-10-08 DIAGNOSIS — J4541 Moderate persistent asthma with (acute) exacerbation: Secondary | ICD-10-CM | POA: Insufficient documentation

## 2019-10-08 MED ORDER — ALBUTEROL SULFATE HFA 108 (90 BASE) MCG/ACT IN AERS: 1.0000 | INHALATION_SPRAY | Freq: Four times a day (QID) | RESPIRATORY_TRACT | 0 refills | Status: DC | PRN

## 2019-10-08 MED ORDER — BUDESONIDE-FORMOTEROL FUMARATE 160-4.5 MCG/ACT IN AERO: 2.0000 | INHALATION_SPRAY | Freq: Two times a day (BID) | RESPIRATORY_TRACT | 4 refills | Status: DC

## 2019-10-08 MED ORDER — PREDNISONE 20 MG PO TABS: 20.0000 mg | ORAL_TABLET | Freq: Two times a day (BID) | ORAL | 0 refills | Status: AC

## 2019-10-08 NOTE — Progress Notes (Signed)
Established Patient Office Visit  Subjective:  Patient ID: Jeffrey Carrillo, male    DOB: 1980-09-30  Age: 39 y.o. MRN: [id number]  CC:  Chief Complaint  Patient presents with  . Follow-up    follow up on asthma and refill on medications, no concerns     HPI Buddy Loeffelholz presents for follow-up of his asthma.  He had been using the Ventolin frequently and requesting frequent refills.  He had been on Breo which had worked quite well for him but can no longer afford it.  Longstanding history of asthma.  No recent illness fever chills cough or drainage.  Past Medical History:  Diagnosis Date  . Allergy   . Asthma   . GERD (gastroesophageal reflux disease)   . HTN (hypertension)     Past Surgical History:  Procedure Laterality Date  . NO PAST SURGERIES      Family History  Problem Relation Age of Onset  . Hypertension Mother   . Diabetes Father   . Hearing loss Father   . Heart attack Father   . Kidney disease Father   . Stroke Maternal Grandmother   . Depression Maternal Grandmother   . Heart attack Maternal Grandfather   . Lung cancer Paternal Grandfather     Social History   Socioeconomic History  . Marital status: Married    Spouse name: Not on file  . Number of children: 2  . Years of education: Not on file  . Highest education level: Not on file  Occupational History  . Not on file  Tobacco Use  . Smoking status: Former Smoker    Start date: 05/08/2012    Quit date: 06/2017    Years since quitting: 2.3  . Smokeless tobacco: Never Used  Substance and Sexual Activity  . Alcohol use: No  . Drug use: No  . Sexual activity: Yes    Birth control/protection: Surgical  Other Topics Concern  . Not on file  Social History Narrative  . Not on file   Social Determinants of Health   Financial Resource Strain:   . Difficulty of Paying Living Expenses: Not on file  Food Insecurity:   . Worried About Programme researcher, broadcasting/film/video in the Last Year: Not on file  .  Ran Out of Food in the Last Year: Not on file  Transportation Needs:   . Lack of Transportation (Medical): Not on file  . Lack of Transportation (Non-Medical): Not on file  Physical Activity:   . Days of Exercise per Week: Not on file  . Minutes of Exercise per Session: Not on file  Stress:   . Feeling of Stress : Not on file  Social Connections:   . Frequency of Communication with Friends and Family: Not on file  . Frequency of Social Gatherings with Friends and Family: Not on file  . Attends Religious Services: Not on file  . Active Member of Clubs or Organizations: Not on file  . Attends Banker Meetings: Not on file  . Marital Status: Not on file  Intimate Partner Violence:   . Fear of Current or Ex-Partner: Not on file  . Emotionally Abused: Not on file  . Physically Abused: Not on file  . Sexually Abused: Not on file    Outpatient Medications Prior to Visit  Medication Sig Dispense Refill  . Fluticasone-Salmeterol (ADVAIR DISKUS) 250-50 MCG/DOSE AEPB Inhale 1 puff into the lungs every 12 (twelve) hours. 60 each 2  . lisinopril (PRINIVIL,ZESTRIL) 10 MG  tablet TAKE 1 TABLET BY MOUTH EVERY DAY 90 tablet 1  . pantoprazole (PROTONIX) 40 MG tablet Take 1 tablet (40 mg total) by mouth daily. PATIENT NEEDS OFFICE VISIT FOR ADDITIONAL REFILLS 30 tablet 0  . albuterol (VENTOLIN HFA) 108 (90 Base) MCG/ACT inhaler Inhale 1 puff into the lungs every 6 (six) hours as needed for wheezing or shortness of breath. 18 g 0  . Omega-3 Fatty Acids (FISH OIL) 1200 MG CAPS Take 1 capsule by mouth daily.    Marland Kitchen sulfacetamide (BLEPH-10) 10 % ophthalmic solution Place 1 drop into the right eye every 3 (three) hours while awake. (Patient not taking: Reported on 10/08/2019) 15 mL 0  . BREO ELLIPTA 200-25 MCG/INH AEPB Inhale 1 puff daily.  0   No facility-administered medications prior to visit.    Allergies  Allergen Reactions  . Codeine Nausea And Vomiting    ROS Review of Systems    Constitutional: Negative.   HENT: Negative.   Respiratory: Positive for shortness of breath and wheezing.   Cardiovascular: Negative.   Gastrointestinal: Negative.   Musculoskeletal: Negative for gait problem.  Skin: Negative for pallor and rash.  Neurological: Negative.   Psychiatric/Behavioral: Negative.       Objective:    Physical Exam  Constitutional: He is oriented to person, place, and time. He appears well-developed and well-nourished. No distress.  HENT:  Head: Normocephalic and atraumatic.  Right Ear: External ear normal.  Left Ear: External ear normal.  Eyes: Conjunctivae are normal. Right eye exhibits no discharge. Left eye exhibits no discharge. No scleral icterus.  Neck: No JVD present. No tracheal deviation present.  Cardiovascular: Normal rate, regular rhythm and normal heart sounds.  Pulmonary/Chest: Effort normal. No stridor. No respiratory distress. He has wheezes. He has no rales.  Abdominal: Bowel sounds are normal.  Neurological: He is alert and oriented to person, place, and time.  Skin: Skin is warm and dry. He is not diaphoretic.  Psychiatric: He has a normal mood and affect. His behavior is normal.    BP 122/70   Pulse 84   Temp 97.6 F (36.4 C) (Tympanic)   Ht 6\' 1"  (1.854 m)   Wt 221 lb 9.6 oz (100.5 kg)   SpO2 96%   BMI 29.24 kg/m  Wt Readings from Last 3 Encounters:  10/08/19 221 lb 9.6 oz (100.5 kg)  10/09/18 208 lb 4 oz (94.5 kg)  05/07/18 245 lb (111.1 kg)     There are no preventive care reminders to display for this patient.  There are no preventive care reminders to display for this patient.  Lab Results  Component Value Date   TSH 1.13 01/28/2018   Lab Results  Component Value Date   WBC 8.4 01/28/2018   HGB 14.9 01/28/2018   HCT 43.1 01/28/2018   MCV 87.0 01/28/2018   PLT 280.0 01/28/2018   Lab Results  Component Value Date   NA 139 01/28/2018   K 4.1 01/28/2018   CO2 24 01/28/2018   GLUCOSE 96 01/28/2018    BUN 11 01/28/2018   CREATININE 0.82 01/28/2018   BILITOT 1.0 10/09/2018   ALKPHOS 47 10/09/2018   AST 15 10/09/2018   ALT 29 10/09/2018   PROT 7.2 10/09/2018   ALBUMIN 5.0 10/09/2018   CALCIUM 9.8 01/28/2018   GFR 112.10 01/28/2018   Lab Results  Component Value Date   CHOL 162 01/28/2018   Lab Results  Component Value Date   HDL 30.70 (L) 01/28/2018   Lab  Results  Component Value Date   LDLCALC 99 01/08/2016   Lab Results  Component Value Date   TRIG 237.0 (H) 01/28/2018   Lab Results  Component Value Date   CHOLHDL 5 01/28/2018   No results found for: HGBA1C    Assessment & Plan:   Problem List Items Addressed This Visit      Respiratory   Moderate persistent asthma with acute exacerbation - Primary   Relevant Medications   budesonide-formoterol (SYMBICORT) 160-4.5 MCG/ACT inhaler   predniSONE (DELTASONE) 20 MG tablet   albuterol (VENTOLIN HFA) 108 (90 Base) MCG/ACT inhaler      Meds ordered this encounter  Medications  . budesonide-formoterol (SYMBICORT) 160-4.5 MCG/ACT inhaler    Sig: Inhale 2 puffs into the lungs 2 (two) times daily.    Dispense:  1 Inhaler    Refill:  4  . predniSONE (DELTASONE) 20 MG tablet    Sig: Take 1 tablet (20 mg total) by mouth 2 (two) times daily with a meal for 7 days.    Dispense:  14 tablet    Refill:  0  . albuterol (VENTOLIN HFA) 108 (90 Base) MCG/ACT inhaler    Sig: Inhale 1 puff into the lungs every 6 (six) hours as needed for wheezing or shortness of breath.    Dispense:  18 g    Refill:  0    Follow-up: Return in about 4 months (around 02/05/2020), or if symptoms worsen or fail to improve.  Discussed the importance of using a controller with a steroid and LABA for adequate control of asthma.  Will take prednisone 20 mg twice daily for a week.  For this time he will use 2 puffs of Symbicort twice daily.  After a week he should be able to decrease it to 1 puff twice daily.  Libby Maw, MD

## 2019-10-08 NOTE — Patient Instructions (Signed)
[email or web address]">  Asthma, Adult  Asthma is a long-term (chronic) condition that causes recurrent episodes in which the airways become tight and narrow. The airways are the passages that lead from the nose and mouth down into the lungs. Asthma episodes, also called asthma attacks, can cause coughing, wheezing, shortness of breath, and chest pain. The airways can also fill with mucus. During an attack, it can be difficult to breathe. Asthma attacks can range from minor to life threatening. Asthma cannot be cured, but medicines and lifestyle changes can help control it and treat acute attacks. What are the causes? This condition is believed to be caused by inherited (genetic) and environmental factors, but its exact cause is not known. There are many things that can bring on an asthma attack or make asthma symptoms worse (triggers). Asthma triggers are different for each person. Common triggers include:  Mold.  Dust.  Cigarette smoke.  Cockroaches.  Things that can cause allergy symptoms (allergens), such as animal dander or pollen from trees or grass.  Air pollutants such as household cleaners, wood smoke, smog, or chemical odors.  Cold air, weather changes, and winds (which increase molds and pollen in the air).  Strong emotional expressions such as crying or laughing hard.  Stress.  Certain medicines (such as aspirin) or types of medicines (such as beta-blockers).  Sulfites in foods and drinks. Foods and drinks that may contain sulfites include dried fruit, potato chips, and sparkling grape juice.  Infections or inflammatory conditions such as the flu, a cold, or inflammation of the nasal membranes (rhinitis).  Gastroesophageal reflux disease (GERD).  Exercise or strenuous activity. What are the signs or symptoms? Symptoms of this condition may occur right after asthma is triggered or many hours later. Symptoms include:  Wheezing. This can  sound like whistling when you breathe.  Excessive nighttime or early morning coughing.  Frequent or severe coughing with a common cold.  Chest tightness.  Shortness of breath.  Tiredness (fatigue) with minimal activity. How is this diagnosed? This condition is diagnosed based on:  Your medical history.  A physical exam.  Tests, which may include: ? Lung function studies and pulmonary studies (spirometry). These tests can evaluate the flow of air in your lungs. ? Allergy tests. ? Imaging tests, such as X-rays. How is this treated? There is no cure for this condition, but treatment can help control your symptoms. Treatment for asthma usually involves:  Identifying and avoiding your asthma triggers.  Using medicines to control your symptoms. Generally, two types of medicines are used to treat asthma: ? Controller medicines. These help prevent asthma symptoms from occurring. They are usually taken every day. ? Fast-acting reliever or rescue medicines. These quickly relieve asthma symptoms by widening the narrow and tight airways. They are used as needed and provide short-term relief.  Using supplemental oxygen. This may be needed during a severe episode.  Using other medicines, such as: ? Allergy medicines, such as antihistamines, if your asthma attacks are triggered by allergens. ? Immune medicines (immunomodulators). These are medicines that help control the immune system.  Creating an asthma action plan. An asthma action plan is a written plan for managing and treating your asthma attacks. This plan includes: ? A list of your asthma triggers and how to avoid them. ? Information about when medicines should be taken and when their dosage should be changed. ? Instructions about using a device called a peak flow meter. A peak flow meter measures how well the lungs are working   and the severity of your asthma. It helps you monitor your condition. Follow these instructions at  home: Controlling your home environment Control your home environment in the following ways to help avoid triggers and prevent asthma attacks:  Change your heating and air conditioning filter regularly.  Limit your use of fireplaces and wood stoves.  Get rid of pests (such as roaches and mice) and their droppings.  Throw away plants if you see mold on them.  Clean floors and dust surfaces regularly. Use unscented cleaning products.  Try to have someone else vacuum for you regularly. Stay out of rooms while they are being vacuumed and for a short while afterward. If you vacuum, use a dust mask from a hardware store, a double-layered or microfilter vacuum cleaner bag, or a vacuum cleaner with a HEPA filter.  Replace carpet with wood, tile, or vinyl flooring. Carpet can trap dander and dust.  Use allergy-proof pillows, mattress covers, and box spring covers.  Keep your bedroom a trigger-free room.  Avoid pets and keep windows closed when allergens are in the air.  Wash beddings every week in hot water and dry them in a dryer.  Use blankets that are made of polyester or cotton.  Clean bathrooms and kitchens with bleach. If possible, have someone repaint the walls in these rooms with mold-resistant paint. Stay out of the rooms that are being cleaned and painted.  Wash your hands often with soap and water. If soap and water are not available, use hand sanitizer.  Do not allow anyone to smoke in your home. General instructions  Take over-the-counter and prescription medicines only as told by your health care provider. ? Speak with your health care provider if you have questions about how or when to take the medicines. ? Make note if you are requiring more frequent dosages.  Do not use any products that contain nicotine or tobacco, such as cigarettes and e-cigarettes. If you need help quitting, ask your health care provider. Also, avoid being exposed to secondhand smoke.  Use a peak  flow meter as told by your health care provider. Record and keep track of the readings.  Understand and use the asthma action plan to help minimize, or stop an asthma attack, without needing to seek medical care.  Make sure you stay up to date on your yearly vaccinations as told by your health care provider. This may include vaccines for the flu and pneumonia.  Avoid outdoor activities when allergen counts are high and when air quality is low.  Wear a ski mask that covers your nose and mouth during outdoor winter activities. Exercise indoors on cold days if you can.  Warm up before exercising, and take time for a cool-down period after exercise.  Keep all follow-up visits as told by your health care provider. This is important. Where to find more information  For information about asthma, turn to the Centers for Disease Control and Prevention at www.cdc.gov/asthma/faqs.htm  For air quality information, turn to AirNow at https://airnow.gov/ Contact a health care provider if:  You have wheezing, shortness of breath, or a cough even while you are taking medicine to prevent attacks.  The mucus you cough up (sputum) is thicker than usual.  Your sputum changes from clear or white to yellow, green, gray, or bloody.  Your medicines are causing side effects, such as a rash, itching, swelling, or trouble breathing.  You need to use a reliever medicine more than 2-3 times a week.  Your peak   flow reading is still at 50-79% of your personal best after following your action plan for 1 hour.  You have a fever. Get help right away if:  You are getting worse and do not respond to treatment during an asthma attack.  You are short of breath when at rest or when doing very little physical activity.  You have difficulty eating, drinking, or talking.  You have chest pain or tightness.  You develop a fast heartbeat or palpitations.  You have a bluish color to your lips or fingernails.  You  are light-headed or dizzy, or you faint.  Your peak flow reading is less than 50% of your personal best.  You feel too tired to breathe normally. Summary  Asthma is a long-term (chronic) condition that causes recurrent episodes in which the airways become tight and narrow. These episodes can cause coughing, wheezing, shortness of breath, and chest pain.  Asthma cannot be cured, but medicines and lifestyle changes can help control it and treat acute attacks.  Make sure you understand how to avoid triggers and how and when to use your medicines.  Asthma attacks can range from minor to life threatening. Get help right away if you have an asthma attack and do not respond to treatment with your usual rescue medicines. This information is not intended to replace advice given to you by your health care provider. Make sure you discuss any questions you have with your health care provider. Document Revised: 10/23/2018 Document Reviewed: 09/24/2016 Elsevier Patient Education  2020 [employer].  [email or web address]">  Asthma, Adult  Asthma is a long-term (chronic) condition that causes recurrent episodes in which the airways become tight and narrow. The airways are the passages that lead from the nose and mouth down into the lungs. Asthma episodes, also called asthma attacks, can cause coughing, wheezing, shortness of breath, and chest pain. The airways can also fill with mucus. During an attack, it can be difficult to breathe. Asthma attacks can range from minor to life threatening. Asthma cannot be cured, but medicines and lifestyle changes can help control it and treat acute attacks. What are the causes? This condition is believed to be caused by inherited (genetic) and environmental factors, but its exact cause is not known. There are many things that can bring on an asthma attack or make asthma symptoms worse (triggers). Asthma triggers are different for each  person. Common triggers include:  Mold.  Dust.  Cigarette smoke.  Cockroaches.  Things that can cause allergy symptoms (allergens), such as animal dander or pollen from trees or grass.  Air pollutants such as household cleaners, wood smoke, smog, or Therapist, occupational.  Cold air, weather changes, and winds (which increase molds and pollen in the air).  Strong emotional expressions such as crying or laughing hard.  Stress.  Certain medicines (such as aspirin) or types of medicines (such as beta-blockers).  Sulfites in foods and drinks. Foods and drinks that may contain sulfites include dried fruit, potato chips, and sparkling grape juice.  Infections or inflammatory conditions such as the flu, a cold, or inflammation of the nasal membranes (rhinitis).  Gastroesophageal reflux disease (GERD).  Exercise or strenuous activity. What are the signs or symptoms? Symptoms of this condition may occur right after asthma is triggered or many hours later. Symptoms include:  Wheezing. This can sound like whistling when you breathe.  Excessive nighttime or early morning coughing.  Frequent or severe coughing with a common cold.  Chest tightness.  Shortness of breath.  Tiredness (fatigue) with minimal activity. How is this diagnosed? This condition is diagnosed based on:  Your medical history.  A physical exam.  Tests, which may include: ? Lung function studies and pulmonary studies (spirometry). These tests can evaluate the flow of air in your lungs. ? Allergy tests. ? Imaging tests, such as X-rays. How is this treated? There is no cure for this condition, but treatment can help control your symptoms. Treatment for asthma usually involves:  Identifying and avoiding your asthma triggers.  Using medicines to control your symptoms. Generally, two types of medicines are used to treat asthma: ? Controller medicines. These help prevent asthma symptoms from occurring. They are  usually taken every day. ? Fast-acting reliever or rescue medicines. These quickly relieve asthma symptoms by widening the narrow and tight airways. They are used as needed and provide short-term relief.  Using supplemental oxygen. This may be needed during a severe episode.  Using other medicines, such as: ? Allergy medicines, such as antihistamines, if your asthma attacks are triggered by allergens. ? Immune medicines (immunomodulators). These are medicines that help control the immune system.  Creating an asthma action plan. An asthma action plan is a written plan for managing and treating your asthma attacks. This plan includes: ? A list of your asthma triggers and how to avoid them. ? Information about when medicines should be taken and when their dosage should be changed. ? Instructions about using a device called a peak flow meter. A peak flow meter measures how well the lungs are working and the severity of your asthma. It helps you monitor your condition. Follow these instructions at home: Controlling your home environment Control your home environment in the following ways to help avoid triggers and prevent asthma attacks:  Change your heating and air conditioning filter regularly.  Limit your use of fireplaces and wood stoves.  Get rid of pests (such as roaches and mice) and their droppings.  Throw away plants if you see mold on them.  Clean floors and dust surfaces regularly. Use unscented cleaning products.  Try to have someone else vacuum for you regularly. Stay out of rooms while they are being vacuumed and for a short while afterward. If you vacuum, use a dust mask from a hardware store, a double-layered or microfilter vacuum cleaner bag, or a vacuum cleaner with a HEPA filter.  Replace carpet with wood, tile, or vinyl flooring. Carpet can trap dander and dust.  Use allergy-proof pillows, mattress covers, and box spring covers.  Keep your bedroom a trigger-free  room.  Avoid pets and keep windows closed when allergens are in the air.  Wash beddings every week in hot water and dry them in a dryer.  Use blankets that are made of polyester or cotton.  Clean bathrooms and kitchens with bleach. If possible, have someone repaint the walls in these rooms with mold-resistant paint. Stay out of the rooms that are being cleaned and painted.  Wash your hands often with soap and water. If soap and water are not available, use hand sanitizer.  Do not allow anyone to smoke in your home. General instructions  Take over-the-counter and prescription medicines only as told by your health care provider. ? Speak with your health care provider if you have questions about how or when to take the medicines. ? Make note if you are requiring more frequent dosages.  Do not use any products that contain nicotine or tobacco, such as cigarettes and e-cigarettes. If you need help  quitting, ask your health care provider. Also, avoid being exposed to secondhand smoke.  Use a peak flow meter as told by your health care provider. Record and keep track of the readings.  Understand and use the asthma action plan to help minimize, or stop an asthma attack, without needing to seek medical care.  Make sure you stay up to date on your yearly vaccinations as told by your health care provider. This may include vaccines for the flu and pneumonia.  Avoid outdoor activities when allergen counts are high and when air quality is low.  Wear a ski mask that covers your nose and mouth during outdoor winter activities. Exercise indoors on cold days if you can.  Warm up before exercising, and take time for a cool-down period after exercise.  Keep all follow-up visits as told by your health care provider. This is important. Where to find more information  For information about asthma, turn to the Centers for Disease Control and Prevention at http://www.mills-berg.com/.htm  For air quality  information, turn to AirNow at [employer] Contact a health care provider if:  You have wheezing, shortness of breath, or a cough even while you are taking medicine to prevent attacks.  The mucus you cough up (sputum) is thicker than usual.  Your sputum changes from clear or white to yellow, green, gray, or bloody.  Your medicines are causing side effects, such as a rash, itching, swelling, or trouble breathing.  You need to use a reliever medicine more than 2-3 times a week.  Your peak flow reading is still at 50-79% of your personal best after following your action plan for 1 hour.  You have a fever. Get help right away if:  You are getting worse and do not respond to treatment during an asthma attack.  You are short of breath when at rest or when doing very little physical activity.  You have difficulty eating, drinking, or talking.  You have chest pain or tightness.  You develop a fast heartbeat or palpitations.  You have a bluish color to your lips or fingernails.  You are light-headed or dizzy, or you faint.  Your peak flow reading is less than 50% of your personal best.  You feel too tired to breathe normally. Summary  Asthma is a long-term (chronic) condition that causes recurrent episodes in which the airways become tight and narrow. These episodes can cause coughing, wheezing, shortness of breath, and chest pain.  Asthma cannot be cured, but medicines and lifestyle changes can help control it and treat acute attacks.  Make sure you understand how to avoid triggers and how and when to use your medicines.  Asthma attacks can range from minor to life threatening. Get help right away if you have an asthma attack and do not respond to treatment with your usual rescue medicines. This information is not intended to replace advice given to you by your health care provider. Make sure you discuss any questions you have with your health care provider. Document  Revised: 10/23/2018 Document Reviewed: 09/24/2016 Elsevier Patient Education  2020 Else[employer] Inc. Budesonide; Formoterol Inhalation What is this medicine? BUDESONIDE; FORMOTEROL (byoo DES oh nide; for MOH te rol) inhalation is a combination of 2 medicines that decrease inflammation and help to open up the airways in your lungs. It is used to treat asthma. It is also used to treat chronic obstructive pulmonary disease (COPD), including chronic bronchitis or emphysema. Do NOT use for an acute asthma or COPD attack. This medicine  may be used for other purposes; ask your health care provider or pharmacist if you have questions. COMMON BRAND NAME(S): Symbicort What should I tell my health care provider before I take this medicine? They need to know if you have any of these conditions:  bone problems  diabetes  eye disease, vision problems  heart disease  high blood pressure  history of irregular heartbeat  immune system problems  infection  liver disease  pheochromocytoma  seizures  thyroid disease  an unusual or allergic reaction to budesonide, formoterol, other medicines, foods, dyes, or preservatives  pregnant or trying to get pregnant  breast-feeding How should I use this medicine? This medicine is inhaled through the mouth. Rinse your mouth with water after use. Make sure not to swallow the water. Follow the directions on your prescription label. Do not use more often than directed. Do not stop taking except on your doctor's advice. Make sure that you are using your inhaler correctly. Ask your doctor or health care provider if you have any questions. A special MedGuide will be given to you by the pharmacist with each prescription and refill. Be sure to read this information carefully each time. Talk to your pediatrician regarding the use of this medicine in children. While this drug may be prescribed for children as young as 70 years of age for selected conditions,  precautions do apply. Overdosage: If you think you have taken too much of this medicine contact a poison control center or emergency room at once. NOTE: This medicine is only for you. Do not share this medicine with others. What if I miss a dose? If you miss a dose, use it as soon as you can. If it is almost time for your next dose, use only that dose. Do not use double or extra doses. What may interact with this medicine? Do not take the medicine with any of the following medications:  cisapride  dofetilide  dronedarone  MAOIs like Marplan, Nardil, and Parnate  other medicines that contain long-acting beta-2 agonists (LABAs) like arfomoterol, formoterol, indacaterol, olodaterol, salmeterol, vilanterol  pimozide  procarbazine  thioridazine This medicine may also interact with the following medications:  certain antibiotics like clarithromycin, telithromycin  certain antivirals for HIV or hepatitis  certain heart medicines like atenolol, metoprolol  certain medicines for blood pressure, heart disease, irregular heartbeat  certain medicines for depression, anxiety, or psychotic disturbances  certain medicines for fungal infections like ketoconazole, itraconazole  diuretics  grapefruit juice  mifepristone  other medicines that prolong the QT interval (an abnormal heart rhythm)  some vaccines  steroid medicines like prednisone or cortisone  stimulant medicines for attention disorders, weight loss, or to stay awake  theophylline This list may not describe all possible interactions. Give your health care provider a list of all the medicines, herbs, non-prescription drugs, or dietary supplements you use. Also tell them if you smoke, drink alcohol, or use illegal drugs. Some items may interact with your medicine. What should I watch for while using this medicine? Visit your health care professional for regular checks on your progress. Tell your health care professional  if your symptoms do not start to get better or if they get worse. If your symptoms get worse or if you need your short-acting inhalers more often, call your doctor right away. This medicine may increase your risk of getting an infection. Tell your doctor or health care professional if you are around anyone with measles or chickenpox, or if you develop sores or  blisters that do not heal properly. What side effects may I notice from receiving this medicine? Side effects that you should report to your doctor or health care professional as soon as possible:  allergic reactions like skin rash, itching or hives, swelling of the face, lips, or tongue  anxious  breathing problems  changes in vision, eye pain  muscle cramps or muscle pain  signs and symptoms of a dangerous change in heartbeat or heart rhythm like chest pain; dizziness; fast or irregular heartbeat; palpitations; feeling faint or lightheaded, falls; breathing problems  signs and symptoms of high blood sugar such as being more thirsty or hungry or having to urinate more than normal. You may also feel very tired or have blurry vision  signs and symptoms of infection like fever; chills; cough; sore throat; pain or trouble passing urine  tremors  unusually weak or tired  white patches in the mouth or mouth sores Side effects that usually do not require medical attention (report these to your doctor or health care professional if they continue or are bothersome):  back pain  changes in taste  cough  diarrhea  runny or stuffy nose  upset stomach This list may not describe all possible side effects. Call your doctor for medical advice about side effects. You may report side effects to FDA at 1-800-FDA-1088. Where should I keep my medicine? Keep out of the reach of children. Store in a dry place at room temperature between 20 and 25 degrees C (68 and 77 degrees F). Do not get the inhaler wet. Keep track of the number of doses  used. Throw away the inhaler after using the marked number of inhalations or after the expiration date, whichever comes first. Do not burn or puncture canister. NOTE: This sheet is a summary. It may not cover all possible information. If you have questions about this medicine, talk to your doctor, pharmacist, or health care provider.  2020 Elsevier/Gold Standard (2019-04-06 15:50:03)

## 2019-10-20 ENCOUNTER — Ambulatory Visit: Payer: 59 | Admitting: Family Medicine

## 2019-11-18 ENCOUNTER — Encounter: Payer: Self-pay | Admitting: Family Medicine

## 2019-12-06 ENCOUNTER — Other Ambulatory Visit: Payer: Self-pay | Admitting: Family Medicine

## 2019-12-10 ENCOUNTER — Encounter: Payer: Self-pay | Admitting: Family Medicine

## 2019-12-10 ENCOUNTER — Other Ambulatory Visit: Payer: Self-pay

## 2019-12-10 MED ORDER — PANTOPRAZOLE SODIUM 40 MG PO TBEC: 40.0000 mg | DELAYED_RELEASE_TABLET | Freq: Every day | ORAL | 0 refills | Status: DC

## 2020-01-02 ENCOUNTER — Other Ambulatory Visit: Payer: Self-pay | Admitting: Family Medicine

## 2020-01-02 DIAGNOSIS — J4541 Moderate persistent asthma with (acute) exacerbation: Secondary | ICD-10-CM

## 2020-01-05 NOTE — Telephone Encounter (Signed)
Called patient to see if he is currently using symbicort. No answer LMTCB

## 2020-01-13 ENCOUNTER — Other Ambulatory Visit: Payer: Self-pay | Admitting: Family Medicine

## 2020-01-28 ENCOUNTER — Other Ambulatory Visit: Payer: Self-pay | Admitting: Family Medicine

## 2020-01-28 DIAGNOSIS — J4541 Moderate persistent asthma with (acute) exacerbation: Secondary | ICD-10-CM

## 2020-02-02 ENCOUNTER — Other Ambulatory Visit: Payer: Self-pay | Admitting: Family Medicine

## 2020-02-04 ENCOUNTER — Other Ambulatory Visit: Payer: Self-pay

## 2020-02-05 ENCOUNTER — Ambulatory Visit (INDEPENDENT_AMBULATORY_CARE_PROVIDER_SITE_OTHER): Payer: BC Managed Care – PPO | Admitting: Family Medicine

## 2020-02-05 ENCOUNTER — Encounter: Payer: Self-pay | Admitting: Family Medicine

## 2020-02-05 VITALS — BP 120/78 | HR 77 | Temp 97.1°F | Ht 73.0 in | Wt 220.4 lb

## 2020-02-05 DIAGNOSIS — Z0000 Encounter for general adult medical examination without abnormal findings: Secondary | ICD-10-CM | POA: Diagnosis not present

## 2020-02-05 DIAGNOSIS — G8929 Other chronic pain: Secondary | ICD-10-CM

## 2020-02-05 DIAGNOSIS — I10 Essential (primary) hypertension: Secondary | ICD-10-CM | POA: Diagnosis not present

## 2020-02-05 DIAGNOSIS — M899 Disorder of bone, unspecified: Secondary | ICD-10-CM

## 2020-02-05 DIAGNOSIS — M25561 Pain in right knee: Secondary | ICD-10-CM

## 2020-02-05 DIAGNOSIS — M25562 Pain in left knee: Secondary | ICD-10-CM

## 2020-02-05 LAB — COMPREHENSIVE METABOLIC PANEL
ALT: 37 U/L (ref 0–53)
AST: 20 U/L (ref 0–37)
Albumin: 4.9 g/dL (ref 3.5–5.2)
Alkaline Phosphatase: 40 U/L (ref 39–117)
BUN: 11 mg/dL (ref 6–23)
CO2: 28 mEq/L (ref 19–32)
Calcium: 10.1 mg/dL (ref 8.4–10.5)
Chloride: 104 mEq/L (ref 96–112)
Creatinine, Ser: 0.81 mg/dL (ref 0.40–1.50)
GFR: 105.84 mL/min (ref >60.00)
Glucose, Bld: 89 mg/dL (ref 70–99)
Potassium: 4.3 mEq/L (ref 3.5–5.1)
Sodium: 139 mEq/L (ref 135–145)
Total Bilirubin: 0.8 mg/dL (ref 0.2–1.2)
Total Protein: 6.9 g/dL (ref 6.0–8.3)

## 2020-02-05 LAB — URINALYSIS, ROUTINE W REFLEX MICROSCOPIC
Hgb urine dipstick: NEGATIVE
Ketones, ur: NEGATIVE
Leukocytes,Ua: NEGATIVE
Nitrite: NEGATIVE
RBC / HPF: NONE SEEN (ref 0–?)
Specific Gravity, Urine: 1.025 (ref 1.000–1.030)
Total Protein, Urine: NEGATIVE
Urine Glucose: NEGATIVE
Urobilinogen, UA: 1 (ref 0.0–1.0)
pH: 6.5 (ref 5.0–8.0)

## 2020-02-05 LAB — CBC
HCT: 45.9 % (ref 39.0–52.0)
Hemoglobin: 15.3 g/dL (ref 13.0–17.0)
MCHC: 33.4 g/dL (ref 30.0–36.0)
MCV: 88.9 fl (ref 78.0–100.0)
Platelets: 341 10*3/uL (ref 150.0–400.0)
RBC: 5.17 Mil/uL (ref 4.22–5.81)
RDW: 14.2 % (ref 11.5–15.5)
WBC: 6.7 10*3/uL (ref 4.0–10.5)

## 2020-02-05 LAB — LDL CHOLESTEROL, DIRECT: Direct LDL: 96 mg/dL

## 2020-02-05 NOTE — Progress Notes (Addendum)
Established Patient Office Visit  Subjective:  Patient ID: Jeffrey Carrillo, male    DOB: [birth year]  Age: 39 y.o. MRN: [id number]  CC:  Chief Complaint  Patient presents with  . Annual Exam    pt here for annual exam, no concerns. Fasting this morning     HPI Jeffrey Carrillo presents for complete physical and he is fasting.  Blood pressures been well controlled on the Zestril and weight loss.  History of elevated liver enzymes that have improved weight loss and improve dietary habits.  Past Medical History:  Diagnosis Date  . Allergy   . Asthma   . GERD (gastroesophageal reflux disease)   . HTN (hypertension)     Past Surgical History:  Procedure Laterality Date  . NO PAST SURGERIES      Family History  Problem Relation Age of Onset  . Hypertension Mother   . Diabetes Father   . Hearing loss Father   . Heart attack Father   . Kidney disease Father   . Stroke Maternal Grandmother   . Depression Maternal Grandmother   . Heart attack Maternal Grandfather   . Lung cancer Paternal Grandfather     Social History   Socioeconomic History  . Marital status: Married    Spouse name: Not on file  . Number of children: 2  . Years of education: Not on file  . Highest education level: Not on file  Occupational History  . Not on file  Tobacco Use  . Smoking status: Former Smoker    Start date: 05/08/2012    Quit date: 06/2017    Years since quitting: 3.0  . Smokeless tobacco: Never Used  Substance and Sexual Activity  . Alcohol use: No  . Drug use: No  . Sexual activity: Yes    Birth control/protection: Surgical  Other Topics Concern  . Not on file  Social History Narrative  . Not on file   Social Determinants of Health   Financial Resource Strain:   . Difficulty of Paying Living Expenses: Not on file  Food Insecurity:   . Worried About Charity fundraiser in the Last Year: Not on file  . Ran Out of Food in the Last Year: Not on file  Transportation Needs:    . Lack of Transportation (Medical): Not on file  . Lack of Transportation (Non-Medical): Not on file  Physical Activity:   . Days of Exercise per Week: Not on file  . Minutes of Exercise per Session: Not on file  Stress:   . Feeling of Stress : Not on file  Social Connections:   . Frequency of Communication with Friends and Family: Not on file  . Frequency of Social Gatherings with Friends and Family: Not on file  . Attends Religious Services: Not on file  . Active Member of Clubs or Organizations: Not on file  . Attends Archivist Meetings: Not on file  . Marital Status: Not on file  Intimate Partner Violence:   . Fear of Current or Ex-Partner: Not on file  . Emotionally Abused: Not on file  . Physically Abused: Not on file  . Sexually Abused: Not on file    Outpatient Medications Prior to Visit  Medication Sig Dispense Refill  . lisinopril (ZESTRIL) 10 MG tablet TAKE 1 TABLET BY MOUTH EVERY DAY 90 tablet 1  . albuterol (VENTOLIN HFA) 108 (90 Base) MCG/ACT inhaler INHALE 1 PUFF INTO THE LUNGS EVERY 6 (SIX) HOURS AS NEEDED FOR WHEEZING OR  SHORTNESS OF BREATH. 8 g 0  . pantoprazole (PROTONIX) 40 MG tablet TAKE 1 TABLET BY MOUTH DAILY 30 tablet 0  . WIXELA INHUB 250-50 MCG/DOSE AEPB USE 1 PUFF INTO THE LUNGS EVERY 12 HOURS 60 each 2  . Omega-3 Fatty Acids (FISH OIL) 1200 MG CAPS Take 1 capsule by mouth daily.    . budesonide-formoterol (SYMBICORT) 160-4.5 MCG/ACT inhaler Inhale 2 puffs into the lungs 2 (two) times daily. (Patient not taking: Reported on 02/05/2020) 1 Inhaler 4  . sulfacetamide (BLEPH-10) 10 % ophthalmic solution Place 1 drop into the right eye every 3 (three) hours while awake. (Patient not taking: Reported on 10/08/2019) 15 mL 0   No facility-administered medications prior to visit.    Allergies  Allergen Reactions  . Codeine Nausea And Vomiting    ROS Review of Systems  Constitutional: Negative.   HENT: Negative.   Eyes: Negative for photophobia  and visual disturbance.  Respiratory: Negative.   Cardiovascular: Negative.   Gastrointestinal: Negative.   Endocrine: Negative for polyphagia and polyuria.  Genitourinary: Negative.   Musculoskeletal: Negative for gait problem and joint swelling.  Skin: Negative for pallor and rash.  Allergic/Immunologic: Negative for immunocompromised state.  Neurological: Negative for tremors and speech difficulty.  Hematological: Does not bruise/bleed easily.  Psychiatric/Behavioral: Negative.    Depression screen [facility] 2/9 02/05/2020 02/05/2020 10/08/2019  Decreased Interest 0 0 0  Down, Depressed, Hopeless 0 0 0  PHQ - 2 Score 0 0 0  Altered sleeping 0 - -  Tired, decreased energy 1 - -  Change in appetite 0 - -  Feeling bad or failure about yourself  0 - -  Trouble concentrating 0 - -  Moving slowly or fidgety/restless 0 - -  Suicidal thoughts 0 - -  PHQ-9 Score 1 - -  Difficult doing work/chores Not difficult at all - -      Objective:    Physical Exam  Constitutional: He is oriented to person, place, and time. He appears well-developed and well-nourished. No distress.  HENT:  Head: Normocephalic and atraumatic.  Right Ear: External ear normal.  Left Ear: External ear normal.  Mouth/Throat: Oropharynx is clear and moist. No oropharyngeal exudate.  Eyes: Conjunctivae are normal. Right eye exhibits no discharge. Left eye exhibits no discharge. No scleral icterus.  Neck: No JVD present. No tracheal deviation present.  Cardiovascular: Normal rate, regular rhythm and normal heart sounds.  Pulmonary/Chest: Effort normal and breath sounds normal. No stridor.  Abdominal: Bowel sounds are normal. Hernia confirmed negative in the right inguinal area and confirmed negative in the left inguinal area.  Genitourinary: Right testis shows no mass, no swelling and no tenderness. Right testis is descended. Left testis shows no mass, no swelling and no tenderness. Left testis is descended. Circumcised. No  hypospadias, penile erythema or penile tenderness. No discharge found.  Musculoskeletal:        General: No edema.  Lymphadenopathy:       Right: No inguinal adenopathy present.       Left: No inguinal adenopathy present.  Neurological: He is alert and oriented to person, place, and time.  Skin: Skin is warm and dry. He is not diaphoretic.  Psychiatric: He has a normal mood and affect. His behavior is normal.    BP 120/78   Pulse 77   Temp (!) 97.1 F (36.2 C) (Tympanic)   Ht '6\' 1"'$  (1.854 m)   Wt 220 lb 6.4 oz (100 kg)   SpO2 97%  BMI 29.08 kg/m  Wt Readings from Last 3 Encounters:  02/05/20 220 lb 6.4 oz (100 kg)  10/08/19 221 lb 9.6 oz (100.5 kg)  10/09/18 208 lb 4 oz (94.5 kg)     Health Maintenance Due  Topic Date Due  . INFLUENZA VACCINE  04/03/2020    There are no preventive care reminders to display for this patient.  Lab Results  Component Value Date   TSH 1.13 01/28/2018   Lab Results  Component Value Date   WBC 6.7 02/05/2020   HGB 15.3 02/05/2020   HCT 45.9 02/05/2020   MCV 88.9 02/05/2020   PLT 341.0 02/05/2020   Lab Results  Component Value Date   NA 139 02/05/2020   K 4.3 02/05/2020   CO2 28 02/05/2020   GLUCOSE 89 02/05/2020   BUN 11 02/05/2020   CREATININE 0.81 02/05/2020   BILITOT 0.8 02/05/2020   ALKPHOS 40 02/05/2020   AST 20 02/05/2020   ALT 37 02/05/2020   PROT 6.9 02/05/2020   ALBUMIN 4.9 02/05/2020   CALCIUM 10.1 02/05/2020   GFR 105.84 02/05/2020   Lab Results  Component Value Date   CHOL 158 02/05/2020   Lab Results  Component Value Date   HDL 37 (L) 02/05/2020   Lab Results  Component Value Date   LDLCALC 92 02/05/2020   Lab Results  Component Value Date   TRIG 164 (H) 02/05/2020   Lab Results  Component Value Date   CHOLHDL 5 01/28/2018   No results found for: HGBA1C    Assessment & Plan:   Problem List Items Addressed This Visit      Cardiovascular and Mediastinum   Essential hypertension    Relevant Orders   Comp Met (CMET) (Completed)     Musculoskeletal and Integument   Bone disorder   Relevant Orders   Vitamin D 1,25 dihydroxy (Completed)     Other   Healthcare maintenance - Primary   Relevant Orders   Urinalysis, Routine w reflex microscopic (Completed)   Direct LDL (Completed)   Lipid Panel w/o Chol/HDL Ratio (Completed)   Comp Met (CMET) (Completed)   CBC (Completed)   Chronic pain of both knees   Relevant Orders   Vitamin D 1,25 dihydroxy (Completed)      No orders of the defined types were placed in this encounter.   Follow-up: Return in about 6 months (around 08/06/2020).   Given information on health maintenance and disease prevention. Libby Maw, MD

## 2020-02-05 NOTE — Patient Instructions (Signed)
Health Maintenance, Male Adopting a healthy lifestyle and getting preventive care are important in promoting health and wellness. Ask your health care provider about:  The right schedule for you to have regular tests and exams.  Things you can do on your own to prevent diseases and keep yourself healthy. What should I know about diet, weight, and exercise? Eat a healthy diet   Eat a diet that includes plenty of vegetables, fruits, low-fat dairy products, and lean protein.  Do not eat a lot of foods that are high in solid fats, added sugars, or sodium. Maintain a healthy weight Body mass index (BMI) is a measurement that can be used to identify possible weight problems. It estimates body fat based on height and weight. Your health care provider can help determine your BMI and help you achieve or maintain a healthy weight. Get regular exercise Get regular exercise. This is one of the most important things you can do for your health. Most adults should:  Exercise for at least 150 minutes each week. The exercise should increase your heart rate and make you sweat (moderate-intensity exercise).  Do strengthening exercises at least twice a week. This is in addition to the moderate-intensity exercise.  Spend less time sitting. Even light physical activity can be beneficial. Watch cholesterol and blood lipids Have your blood tested for lipids and cholesterol at 40 years of age, then have this test every 5 years. You may need to have your cholesterol levels checked more often if:  Your lipid or cholesterol levels are high.  You are older than 40 years of age.  You are at high risk for heart disease. What should I know about cancer screening? Many types of cancers can be detected early and may often be prevented. Depending on your health history and family history, you may need to have cancer screening at various ages. This may include screening for:  Colorectal cancer.  Prostate  cancer.  Skin cancer.  Lung cancer. What should I know about heart disease, diabetes, and high blood pressure? Blood pressure and heart disease  High blood pressure causes heart disease and increases the risk of stroke. This is more likely to develop in people who have high blood pressure readings, are of African descent, or are overweight.  Talk with your health care provider about your target blood pressure readings.  Have your blood pressure checked: ? Every 3-5 years if you are 30-46 years of age. ? Every year if you are 78 years old or older.  If you are between the ages of 69 and 47 and are a current or former smoker, ask your health care provider if you should have a one-time screening for abdominal aortic aneurysm (AAA). Diabetes Have regular diabetes screenings. This checks your fasting blood sugar level. Have the screening done:  Once every three years after age 60 if you are at a normal weight and have a low risk for diabetes.  More often and at a younger age if you are overweight or have a high risk for diabetes. What should I know about preventing infection? Hepatitis B If you have a higher risk for hepatitis B, you should be screened for this virus. Talk with your health care provider to find out if you are at risk for hepatitis B infection. Hepatitis C Blood testing is recommended for:  Everyone born from [birth year] through 1966.  Anyone with known risk factors for hepatitis C. Sexually transmitted infections (STIs)  You should be screened each year  for STIs, including gonorrhea and chlamydia, if: ? You are sexually active and are younger than 40 years of age. ? You are older than 40 years of age and your health care provider tells you that you are at risk for this type of infection. ? Your sexual activity has changed since you were last screened, and you are at increased risk for chlamydia or gonorrhea. Ask your health care provider if you are at risk.  Ask your  health care provider about whether you are at high risk for HIV. Your health care provider may recommend a prescription medicine to help prevent HIV infection. If you choose to take medicine to prevent HIV, you should first get tested for HIV. You should then be tested every 3 months for as long as you are taking the medicine. Follow these instructions at home: Lifestyle  Do not use any products that contain nicotine or tobacco, such as cigarettes, e-cigarettes, and chewing tobacco. If you need help quitting, ask your health care provider.  Do not use street drugs.  Do not share needles.  Ask your health care provider for help if you need support or information about quitting drugs. Alcohol use  Do not drink alcohol if your health care provider tells you not to drink.  If you drink alcohol: ? Limit how much you have to 0-2 drinks a day. ? Be aware of how much alcohol is in your drink. In the [place] one drink equals one 12 oz bottle of beer (355 mL), one 5 oz glass of wine (148 mL), or one 1 oz glass of hard liquor (44 mL). General instructions  Schedule regular health, dental, and eye exams.  Stay current with your vaccines.  Tell your health care provider if: ? You often feel depressed. ? You have ever been abused or do not feel safe at home. Summary  Adopting a healthy lifestyle and getting preventive care are important in promoting health and wellness.  Follow your health care provider's instructions about healthy diet, exercising, and getting tested or screened for diseases.  Follow your health care provider's instructions on monitoring your cholesterol and blood pressure. This information is not intended to replace advice given to you by your health care provider. Make sure you discuss any questions you have with your health care provider. Document Revised: 08/13/2018 Document Reviewed: 08/13/2018 Elsevier Patient Education  2020 Elsevier Inc.  Preventive Care 21-39 Years  Old, Male Preventive care refers to lifestyle choices and visits with your health care provider that can promote health and wellness. This includes:  A yearly physical exam. This is also called an annual well check.  Regular dental and eye exams.  Immunizations.  Screening for certain conditions.  Healthy lifestyle choices, such as eating a healthy diet, getting regular exercise, not using drugs or products that contain nicotine and tobacco, and limiting alcohol use. What can I expect for my preventive care visit? Physical exam Your health care provider will check:  Height and weight. These may be used to calculate body mass index (BMI), which is a measurement that tells if you are at a healthy weight.  Heart rate and blood pressure.  Your skin for abnormal spots. Counseling Your health care provider may ask you questions about:  Alcohol, tobacco, and drug use.  Emotional well-being.  Home and relationship well-being.  Sexual activity.  Eating habits.  Work and work environment. What immunizations do I need?  Influenza (flu) vaccine  This is recommended every year. Tetanus, diphtheria,   and pertussis (Tdap) vaccine  You may need a Td booster every 10 years. Varicella (chickenpox) vaccine  You may need this vaccine if you have not already been vaccinated. Human papillomavirus (HPV) vaccine  If recommended by your health care provider, you may need three doses over 6 months. Measles, mumps, and rubella (MMR) vaccine  You may need at least one dose of MMR. You may also need a second dose. Meningococcal conjugate (MenACWY) vaccine  One dose is recommended if you are 94-89 years old and a Market researcher living in a residence hall, or if you have one of several medical conditions. You may also need additional booster doses. Pneumococcal conjugate (PCV13) vaccine  You may need this if you have certain conditions and were not previously  vaccinated. Pneumococcal polysaccharide (PPSV23) vaccine  You may need one or two doses if you smoke cigarettes or if you have certain conditions. Hepatitis A vaccine  You may need this if you have certain conditions or if you travel or work in places where you may be exposed to hepatitis A. Hepatitis B vaccine  You may need this if you have certain conditions or if you travel or work in places where you may be exposed to hepatitis B. Haemophilus influenzae type b (Hib) vaccine  You may need this if you have certain risk factors. You may receive vaccines as individual doses or as more than one vaccine together in one shot (combination vaccines). Talk with your health care provider about the risks and benefits of combination vaccines. What tests do I need? Blood tests  Lipid and cholesterol levels. These may be checked every 5 years starting at age 26.  Hepatitis C test.  Hepatitis B test. Screening   Diabetes screening. This is done by checking your blood sugar (glucose) after you have not eaten for a while (fasting).  Sexually transmitted disease (STD) testing. Talk with your health care provider about your test results, treatment options, and if necessary, the need for more tests. Follow these instructions at home: Eating and drinking   Eat a diet that includes fresh fruits and vegetables, whole grains, lean protein, and low-fat dairy products.  Take vitamin and mineral supplements as recommended by your health care provider.  Do not drink alcohol if your health care provider tells you not to drink.  If you drink alcohol: ? Limit how much you have to 0-2 drinks a day. ? Be aware of how much alcohol is in your drink. In the [place] one drink equals one 12 oz bottle of beer (355 mL), one 5 oz glass of wine (148 mL), or one 1 oz glass of hard liquor (44 mL). Lifestyle  Take daily care of your teeth and gums.  Stay active. Exercise for at least 30 minutes on 5 or more days  each week.  Do not use any products that contain nicotine or tobacco, such as cigarettes, e-cigarettes, and chewing tobacco. If you need help quitting, ask your health care provider.  If you are sexually active, practice safe sex. Use a condom or other form of protection to prevent STIs (sexually transmitted infections). What's next?  Go to your health care provider once a year for a well check visit.  Ask your health care provider how often you should have your eyes and teeth checked.  Stay up to date on all vaccines. This information is not intended to replace advice given to you by your health care provider. Make sure you discuss any questions you  have with your health care provider. Document Revised: 08/14/2018 Document Reviewed: 08/14/2018 Elsevier Patient Education  2020 Elsevier Inc.  

## 2020-02-06 ENCOUNTER — Other Ambulatory Visit: Payer: Self-pay | Admitting: Family Medicine

## 2020-02-06 LAB — LIPID PANEL W/O CHOL/HDL RATIO
Cholesterol, Total: 158 mg/dL (ref 100–199)
HDL: 37 mg/dL — ABNORMAL LOW (ref >39)
LDL Chol Calc (NIH): 92 mg/dL (ref 0–99)
Triglycerides: 164 mg/dL — ABNORMAL HIGH (ref 0–149)
VLDL Cholesterol Cal: 29 mg/dL (ref 5–40)

## 2020-02-09 LAB — VITAMIN D 1,25 DIHYDROXY
Vitamin D 1, 25 (OH)2 Total: 46 pg/mL (ref 18–72)
Vitamin D2 1, 25 (OH)2: <8 pg/mL
Vitamin D3 1, 25 (OH)2: 46 pg/mL

## 2020-02-25 ENCOUNTER — Other Ambulatory Visit: Payer: Self-pay | Admitting: Family Medicine

## 2020-02-25 DIAGNOSIS — J4541 Moderate persistent asthma with (acute) exacerbation: Secondary | ICD-10-CM

## 2020-04-11 ENCOUNTER — Telehealth: Payer: Self-pay | Admitting: Family Medicine

## 2020-04-11 NOTE — Telephone Encounter (Signed)
Pt calling saying he received a call from his insurance company stating that the physical he had recently was coded wrong and they needed it changed in order to cover it. They said it needs to be changed to a routine visit. Not sure how to go about this

## 2020-04-14 NOTE — Telephone Encounter (Signed)
Spoke to pt about bills. Pt owes $11.80 from 6/4 cpe appointment. Urinalysis and Vit D labs were not covered under DX for preventative maint but the fee was reduced and total is $11.80. Pt states he got a call from a company saying he had a $300 pathology bill that needs recoded. I do not see our office ordered any pathology.

## 2020-05-26 ENCOUNTER — Telehealth: Payer: Self-pay | Admitting: Family Medicine

## 2020-05-26 NOTE — Telephone Encounter (Signed)
Patient is calling and stated that he came in the office for a CPE on 6/4 and received a bill from [facility]. Per patient [facility] told him to contact the office to see if Dr. Doreene Burke could update the code so insurance could cover cost, please advise. CB is [phone].

## 2020-05-27 NOTE — Telephone Encounter (Signed)
Spoke to pt who states he has bill for $300+ from [place] and they told him to call MD office to have [place] submit different diagnosis.  Spoke to Stepney at [facility], ph # [phone], who confirmed that insurance denied Vit D lab as noncovered due to DX of Z00.00 preventative maint & self pay bill of $319. We do not other diagnosis codes to bill. Msg sent to [facility]Billing.

## 2020-05-30 ENCOUNTER — Other Ambulatory Visit: Payer: Self-pay | Admitting: Family Medicine

## 2020-05-30 NOTE — Telephone Encounter (Signed)
Jeffrey Carrillo states we are unable to rebill/recode. Msg sent to [facility].

## 2020-05-31 ENCOUNTER — Other Ambulatory Visit: Payer: Self-pay | Admitting: Family Medicine

## 2020-05-31 DIAGNOSIS — J4541 Moderate persistent asthma with (acute) exacerbation: Secondary | ICD-10-CM

## 2020-06-13 DIAGNOSIS — M899 Disorder of bone, unspecified: Secondary | ICD-10-CM | POA: Insufficient documentation

## 2020-06-13 NOTE — Telephone Encounter (Signed)
[facility] sent documentation of what is covered. I have given to Dr. Doreene Burke to review.

## 2020-06-20 NOTE — Telephone Encounter (Signed)
Dr. Doreene Burke updated DX 10/14 and msg sent to Chip Boer at [facility]. DX updated and sent to be rebilled. Notified pt and he will follow up with [facility] if further issues/billing

## 2020-07-24 ENCOUNTER — Other Ambulatory Visit: Payer: Self-pay | Admitting: Family Medicine

## 2020-08-08 ENCOUNTER — Ambulatory Visit: Payer: BC Managed Care – PPO | Admitting: Family Medicine

## 2020-08-10 ENCOUNTER — Other Ambulatory Visit: Payer: Self-pay | Admitting: Family Medicine

## 2020-08-10 NOTE — Telephone Encounter (Signed)
Last OV 02/05/20 Last fill 12/07/19  #90/1

## 2020-09-12 ENCOUNTER — Ambulatory Visit: Payer: BC Managed Care – PPO | Admitting: Family Medicine

## 2020-10-01 ENCOUNTER — Other Ambulatory Visit: Payer: Self-pay | Admitting: Family Medicine

## 2020-10-01 DIAGNOSIS — J4541 Moderate persistent asthma with (acute) exacerbation: Secondary | ICD-10-CM

## 2020-10-18 ENCOUNTER — Ambulatory Visit: Payer: BC Managed Care – PPO | Admitting: Family Medicine

## 2020-11-18 ENCOUNTER — Other Ambulatory Visit: Payer: Self-pay | Admitting: Family Medicine

## 2020-11-21 ENCOUNTER — Ambulatory Visit (INDEPENDENT_AMBULATORY_CARE_PROVIDER_SITE_OTHER): Payer: BC Managed Care – PPO | Admitting: Family Medicine

## 2020-11-21 ENCOUNTER — Encounter: Payer: Self-pay | Admitting: Family Medicine

## 2020-11-21 ENCOUNTER — Other Ambulatory Visit: Payer: Self-pay

## 2020-11-21 VITALS — BP 118/70 | HR 87 | Temp 97.4°F | Ht 73.0 in | Wt 219.4 lb

## 2020-11-21 DIAGNOSIS — I10 Essential (primary) hypertension: Secondary | ICD-10-CM

## 2020-11-21 DIAGNOSIS — J4540 Moderate persistent asthma, uncomplicated: Secondary | ICD-10-CM | POA: Diagnosis not present

## 2020-11-21 DIAGNOSIS — G5622 Lesion of ulnar nerve, left upper limb: Secondary | ICD-10-CM

## 2020-11-21 DIAGNOSIS — K219 Gastro-esophageal reflux disease without esophagitis: Secondary | ICD-10-CM | POA: Diagnosis not present

## 2020-11-21 LAB — BASIC METABOLIC PANEL
BUN: 11 mg/dL (ref 6–23)
CO2: 25 mEq/L (ref 19–32)
Calcium: 10.2 mg/dL (ref 8.4–10.5)
Chloride: 104 mEq/L (ref 96–112)
Creatinine, Ser: 0.8 mg/dL (ref 0.40–1.50)
GFR: 110.9 mL/min (ref >60.00)
Glucose, Bld: 90 mg/dL (ref 70–99)
Potassium: 4.5 mEq/L (ref 3.5–5.1)
Sodium: 139 mEq/L (ref 135–145)

## 2020-11-21 MED ORDER — PANTOPRAZOLE SODIUM 40 MG PO TBEC: 40.0000 mg | DELAYED_RELEASE_TABLET | Freq: Every day | ORAL | 4 refills | Status: DC

## 2020-11-21 MED ORDER — LISINOPRIL 10 MG PO TABS: 10.0000 mg | ORAL_TABLET | Freq: Every day | ORAL | 4 refills | Status: DC

## 2020-11-21 MED ORDER — FLUTICASONE-SALMETEROL 250-50 MCG/DOSE IN AEPB: INHALATION_SPRAY | RESPIRATORY_TRACT | 4 refills | Status: DC

## 2020-11-21 NOTE — Patient Instructions (Addendum)
http://www.aaaai.org/conditions-and-treatments/asthma">  Asthma, Adult  Asthma is a long-term (chronic) condition that causes recurrent episodes in which the airways become tight and narrow. The airways are the passages that lead from the nose and mouth down into the lungs. Asthma episodes, also called asthma attacks, can cause coughing, wheezing, shortness of breath, and chest pain. The airways can also fill with mucus. During an attack, it can be difficult to breathe. Asthma attacks can range from minor to life threatening. Asthma cannot be cured, but medicines and lifestyle changes can help control it and treat acute attacks. What are the causes? This condition is believed to be caused by inherited (genetic) and environmental factors, but its exact cause is not known. There are many things that can bring on an asthma attack or make asthma symptoms worse (triggers). Asthma triggers are different for each person. Common triggers include:  Mold.  Dust.  Cigarette smoke.  Cockroaches.  Things that can cause allergy symptoms (allergens), such as animal dander or pollen from trees or grass.  Air pollutants such as household cleaners, wood smoke, smog, or chemical odors.  Cold air, weather changes, and winds (which increase molds and pollen in the air).  Strong emotional expressions such as crying or laughing hard.  Stress.  Certain medicines (such as aspirin) or types of medicines (such as beta-blockers).  Sulfites in foods and drinks. Foods and drinks that may contain sulfites include dried fruit, potato chips, and sparkling grape juice.  Infections or inflammatory conditions such as the flu, a cold, or inflammation of the nasal membranes (rhinitis).  Gastroesophageal reflux disease (GERD).  Exercise or strenuous activity. What are the signs or symptoms? Symptoms of this condition may occur right after asthma is triggered or many hours later. Symptoms include:  Wheezing. This can  sound like whistling when you breathe.  Excessive nighttime or early morning coughing.  Frequent or severe coughing with a common cold.  Chest tightness.  Shortness of breath.  Tiredness (fatigue) with minimal activity. How is this diagnosed? This condition is diagnosed based on:  Your medical history.  A physical exam.  Tests, which may include: ? Lung function studies and pulmonary studies (spirometry). These tests can evaluate the flow of air in your lungs. ? Allergy tests. ? Imaging tests, such as X-rays. How is this treated? There is no cure for this condition, but treatment can help control your symptoms. Treatment for asthma usually involves:  Identifying and avoiding your asthma triggers.  Using medicines to control your symptoms. Generally, two types of medicines are used to treat asthma: ? Controller medicines. These help prevent asthma symptoms from occurring. They are usually taken every day. ? Fast-acting reliever or rescue medicines. These quickly relieve asthma symptoms by widening the narrow and tight airways. They are used as needed and provide short-term relief.  Using supplemental oxygen. This may be needed during a severe episode.  Using other medicines, such as: ? Allergy medicines, such as antihistamines, if your asthma attacks are triggered by allergens. ? Immune medicines (immunomodulators). These are medicines that help control the immune system.  Creating an asthma action plan. An asthma action plan is a written plan for managing and treating your asthma attacks. This plan includes: ? A list of your asthma triggers and how to avoid them. ? Information about when medicines should be taken and when their dosage should be changed. ? Instructions about using a device called a peak flow meter. A peak flow meter measures how well the lungs are working   and the severity of your asthma. It helps you monitor your condition. Follow these instructions at  home: Controlling your home environment Control your home environment in the following ways to help avoid triggers and prevent asthma attacks:  Change your heating and air conditioning filter regularly.  Limit your use of fireplaces and wood stoves.  Get rid of pests (such as roaches and mice) and their droppings.  Throw away plants if you see mold on them.  Clean floors and dust surfaces regularly. Use unscented cleaning products.  Try to have someone else vacuum for you regularly. Stay out of rooms while they are being vacuumed and for a short while afterward. If you vacuum, use a dust mask from a hardware store, a double-layered or microfilter vacuum cleaner bag, or a vacuum cleaner with a HEPA filter.  Replace carpet with wood, tile, or vinyl flooring. Carpet can trap dander and dust.  Use allergy-proof pillows, mattress covers, and box spring covers.  Keep your bedroom a trigger-free room.  Avoid pets and keep windows closed when allergens are in the air.  Wash beddings every week in hot water and dry them in a dryer.  Use blankets that are made of polyester or cotton.  Clean bathrooms and kitchens with bleach. If possible, have someone repaint the walls in these rooms with mold-resistant paint. Stay out of the rooms that are being cleaned and painted.  Wash your hands often with soap and water. If soap and water are not available, use hand sanitizer.  Do not allow anyone to smoke in your home. General instructions  Take over-the-counter and prescription medicines only as told by your health care provider. ? Speak with your health care provider if you have questions about how or when to take the medicines. ? Make note if you are requiring more frequent dosages.  Do not use any products that contain nicotine or tobacco, such as cigarettes and e-cigarettes. If you need help quitting, ask your health care provider. Also, avoid being exposed to secondhand smoke.  Use a peak  flow meter as told by your health care provider. Record and keep track of the readings.  Understand and use the asthma action plan to help minimize, or stop an asthma attack, without needing to seek medical care.  Make sure you stay up to date on your yearly vaccinations as told by your health care provider. This may include vaccines for the flu and pneumonia.  Avoid outdoor activities when allergen counts are high and when air quality is low.  Wear a ski mask that covers your nose and mouth during outdoor winter activities. Exercise indoors on cold days if you can.  Warm up before exercising, and take time for a cool-down period after exercise.  Keep all follow-up visits as told by your health care provider. This is important. Where to find more information  For information about asthma, turn to the Centers for Disease Control and Prevention at www.cdc.gov/asthma/faqs  For air quality information, turn to AirNow at airnow.gov Contact a health care provider if:  You have wheezing, shortness of breath, or a cough even while you are taking medicine to prevent attacks.  The mucus you cough up (sputum) is thicker than usual.  Your sputum changes from clear or white to yellow, green, gray, or bloody.  Your medicines are causing side effects, such as a rash, itching, swelling, or trouble breathing.  You need to use a reliever medicine more than 2-3 times a week.  Your peak   flow reading is still at 50-79% of your personal best after following your action plan for 1 hour.  You have a fever. Get help right away if:  You are getting worse and do not respond to treatment during an asthma attack.  You are short of breath when at rest or when doing very little physical activity.  You have difficulty eating, drinking, or talking.  You have chest pain or tightness.  You develop a fast heartbeat or palpitations.  You have a bluish color to your lips or fingernails.  You are  light-headed or dizzy, or you faint.  Your peak flow reading is less than 50% of your personal best.  You feel too tired to breathe normally. Summary  Asthma is a long-term (chronic) condition that causes recurrent episodes in which the airways become tight and narrow. These episodes can cause coughing, wheezing, shortness of breath, and chest pain.  Asthma cannot be cured, but medicines and lifestyle changes can help control it and treat acute attacks.  Make sure you understand how to avoid triggers and how and when to use your medicines.  Asthma attacks can range from minor to life threatening. Get help right away if you have an asthma attack and do not respond to treatment with your usual rescue medicines. This information is not intended to replace advice given to you by your health care provider. Make sure you discuss any questions you have with your health care provider. Document Revised: 05/20/2020 Document Reviewed: 12/23/2019 Elsevier Patient Education  2021 [employer]  Managing Your Hypertension Hypertension, also called high blood pressure, is when the force of the blood pressing against the walls of the arteries is too strong. Arteries are blood vessels that carry blood from your heart throughout your body. Hypertension forces the heart to work harder to pump blood and may cause the arteries to become narrow or stiff. Understanding blood pressure readings Your personal target blood pressure may vary depending on your medical conditions, your age, and other factors. A blood pressure reading includes a higher number over a lower number. Ideally, your blood pressure should be below 120/80. You should know that:  The first, or top, number is called the systolic pressure. It is a measure of the pressure in your arteries as your heart beats.  The second, or bottom number, is called the diastolic pressure. It is a measure of the pressure in your arteries as the heart relaxes. Blood  pressure is classified into four stages. Based on your blood pressure reading, your health care provider may use the following stages to determine what type of treatment you need, if any. Systolic pressure and diastolic pressure are measured in a unit called mmHg. Normal  Systolic pressure: below 120.  Diastolic pressure: below 80. Elevated  Systolic pressure: 120-129.  Diastolic pressure: below 80. Hypertension stage 1  Systolic pressure: 130-139.  Diastolic pressure: 80-89. Hypertension stage 2  Systolic pressure: 140 or above.  Diastolic pressure: 90 or above. How can this condition affect me? Managing your hypertension is an important responsibility. Over time, hypertension can damage the arteries and decrease blood flow to important parts of the body, including the brain, heart, and kidneys. Having untreated or uncontrolled hypertension can lead to:  A heart attack.  A stroke.  A weakened blood vessel (aneurysm).  Heart failure.  Kidney damage.  Eye damage.  Metabolic syndrome.  Memory and concentration problems.  Vascular dementia. What actions can I take to manage this condition? Hypertension can be  managed by making lifestyle changes and possibly by taking medicines. Your health care provider will help you make a plan to bring your blood pressure within a normal range. Nutrition  Eat a diet that is high in fiber and potassium, and low in salt (sodium), added sugar, and fat. An example eating plan is called the Dietary Approaches to Stop Hypertension (DASH) diet. To eat this way: ? Eat plenty of fresh fruits and vegetables. Try to fill one-half of your plate at each meal with fruits and vegetables. ? Eat whole grains, such as whole-wheat pasta, brown rice, or whole-grain bread. Fill about one-fourth of your plate with whole grains. ? Eat low-fat dairy products. ? Avoid fatty cuts of meat, processed or cured meats, and poultry with skin. Fill about one-fourth of  your plate with lean proteins such as fish, chicken without skin, beans, eggs, and tofu. ? Avoid pre-made and processed foods. These tend to be higher in sodium, added sugar, and fat.  Reduce your daily sodium intake. Most people with hypertension should eat less than 1,500 mg of sodium a day.   Lifestyle  Work with your health care provider to maintain a healthy body weight or to lose weight. Ask what an ideal weight is for you.  Get at least 30 minutes of exercise that causes your heart to beat faster (aerobic exercise) most days of the week. Activities may include walking, swimming, or biking.  Include exercise to strengthen your muscles (resistance exercise), such as weight lifting, as part of your weekly exercise routine. Try to do these types of exercises for 30 minutes at least 3 days a week.  Do not use any products that contain nicotine or tobacco, such as cigarettes, e-cigarettes, and chewing tobacco. If you need help quitting, ask your health care provider.  Control any long-term (chronic) conditions you have, such as high cholesterol or diabetes.  Identify your sources of stress and find ways to manage stress. This may include meditation, deep breathing, or making time for fun activities.   Alcohol use  Do not drink alcohol if: ? Your health care provider tells you not to drink. ? You are pregnant, may be pregnant, or are planning to become pregnant.  If you drink alcohol: ? Limit how much you use to:  0-1 drink a day for women.  0-2 drinks a day for men. ? Be aware of how much alcohol is in your drink. In the [place] one drink equals one 12 oz bottle of beer (355 mL), one 5 oz glass of wine (148 mL), or one 1 oz glass of hard liquor (44 mL). Medicines Your health care provider may prescribe medicine if lifestyle changes are not enough to get your blood pressure under control and if:  Your systolic blood pressure is 130 or higher.  Your diastolic blood pressure is 80 or  higher. Take medicines only as told by your health care provider. Follow the directions carefully. Blood pressure medicines must be taken as told by your health care provider. The medicine does not work as well when you skip doses. Skipping doses also puts you at risk for problems. Monitoring Before you monitor your blood pressure:  Do not smoke, drink caffeinated beverages, or exercise within 30 minutes before taking a measurement.  Use the bathroom and empty your bladder (urinate).  Sit quietly for at least 5 minutes before taking measurements. Monitor your blood pressure at home as told by your health care provider. To do this:  Sit with  your back straight and supported.  Place your feet flat on the floor. Do not cross your legs.  Support your arm on a flat surface, such as a table. Make sure your upper arm is at heart level.  Each time you measure, take two or three readings one minute apart and record the results. You may also need to have your blood pressure checked regularly by your health care provider.   General information  Talk with your health care provider about your diet, exercise habits, and other lifestyle factors that may be contributing to hypertension.  Review all the medicines you take with your health care provider because there may be side effects or interactions.  Keep all visits as told by your health care provider. Your health care provider can help you create and adjust your plan for managing your high blood pressure. Where to find more information  National Heart, Lung, and Blood Institute: PopSteam.is  American Heart Association: www.heart.org Contact a health care provider if:  You think you are having a reaction to medicines you have taken.  You have repeated (recurrent) headaches.  You feel dizzy.  You have swelling in your ankles.  You have trouble with your vision. Get help right away if:  You develop a severe headache or  confusion.  You have unusual weakness or numbness, or you feel faint.  You have severe pain in your chest or abdomen.  You vomit repeatedly.  You have trouble breathing. These symptoms may represent a serious problem that is an emergency. Do not wait to see if the symptoms will go away. Get medical help right away. Call your local emergency services (911 in the [place] Do not drive yourself to the hospital. Summary  Hypertension is when the force of blood pumping through your arteries is too strong. If this condition is not controlled, it may put you at risk for serious complications.  Your personal target blood pressure may vary depending on your medical conditions, your age, and other factors. For most people, a normal blood pressure is less than 120/80.  Hypertension is managed by lifestyle changes, medicines, or both.  Lifestyle changes to help manage hypertension include losing weight, eating a healthy, low-sodium diet, exercising more, stopping smoking, and limiting alcohol. This information is not intended to replace advice given to you by your health care provider. Make sure you discuss any questions you have with your health care provider. Document Revised: 09/25/2019 Document Reviewed: 07/21/2019 Elsevier Patient Education  2021 [employer].

## 2020-11-21 NOTE — Progress Notes (Addendum)
Established Patient Office Visit  Subjective:  Patient ID: Jeffrey Carrillo, male    DOB: 05-18-1981  Age: 40 y.o. MRN: [id number]  CC:  Chief Complaint  Patient presents with   Follow-up    Follow up no concerns.     HPI Jeffrey Carrillo presents for follow-up of asthma, hypertension and reflux.  Reflux is well controlled with daily Protonix.  Asthma well-controlled on most days he is able to use his controller inhaler only once.  He uses it twice on occasion.  Rarely using his rescue inhaler.  Blood pressure well controlled with lisinopril he is having no issues taking it.  He exercises by walking his dog and has a new cane for working out in front of the computer.  Past Medical History:  Diagnosis Date   Allergy    Asthma    GERD (gastroesophageal reflux disease)    HTN (hypertension)     Past Surgical History:  Procedure Laterality Date   NO PAST SURGERIES      Family History  Problem Relation Age of Onset   Hypertension Mother    Diabetes Father    Hearing loss Father    Heart attack Father    Kidney disease Father    Stroke Maternal Grandmother    Depression Maternal Grandmother    Heart attack Maternal Grandfather    Lung cancer Paternal Grandfather     Social History   Socioeconomic History   Marital status: Married    Spouse name: Not on file   Number of children: 2   Years of education: Not on file   Highest education level: Not on file  Occupational History   Not on file  Tobacco Use   Smoking status: Former Smoker    Start date: 05/08/2012    Quit date: 06/2017    Years since quitting: 3.4   Smokeless tobacco: Never Used  Substance and Sexual Activity   Alcohol use: No   Drug use: No   Sexual activity: Yes    Birth control/protection: Surgical  Other Topics Concern   Not on file  Social History Narrative   Not on file   Social Determinants of Health   Financial Resource Strain: Not on file  Food Insecurity: Not on file   Transportation Needs: Not on file  Physical Activity: Not on file  Stress: Not on file  Social Connections: Not on file  Intimate Partner Violence: Not on file    Outpatient Medications Prior to Visit  Medication Sig Dispense Refill   albuterol (VENTOLIN HFA) 108 (90 Base) MCG/ACT inhaler INHALE 1 PUFF INTO THE LUNGS EVERY 6 (SIX) HOURS AS NEEDED FOR WHEEZING OR SHORTNESS OF BREATH. 6.7 each 2   lisinopril (ZESTRIL) 10 MG tablet TAKE 1 TABLET BY MOUTH EVERY DAY 30 tablet 5   pantoprazole (PROTONIX) 40 MG tablet TAKE 1 TABLET BY MOUTH EVERY DAY 30 tablet 5   WIXELA INHUB 250-50 MCG/DOSE AEPB USE 1 PUFF INTO THE LUNGS EVERY 12 HOURS 60 each 2   Omega-3 Fatty Acids (FISH OIL) 1200 MG CAPS Take 1 capsule by mouth daily. (Patient not taking: Reported on 11/21/2020)     No facility-administered medications prior to visit.    Allergies  Allergen Reactions   Codeine Nausea And Vomiting    ROS Review of Systems  Constitutional: Negative.   HENT: Negative.   Eyes: Negative for photophobia and visual disturbance.  Respiratory: Negative.  Negative for chest tightness, shortness of breath and wheezing.   Cardiovascular:  Negative.   Gastrointestinal: Negative.   Endocrine: Negative for polyphagia and polyuria.  Genitourinary: Negative.   Musculoskeletal: Negative for gait problem and joint swelling.  Skin: Negative for pallor and rash.  Allergic/Immunologic: Negative for immunocompromised state.  Neurological: Negative for speech difficulty and weakness.  Hematological: Does not bruise/bleed easily.  Psychiatric/Behavioral: Negative.       Objective:    Physical Exam Vitals and nursing note reviewed.  Constitutional:      General: He is not in acute distress.    Appearance: Normal appearance. He is not ill-appearing, toxic-appearing or diaphoretic.  HENT:     Head: Normocephalic and atraumatic.     Right Ear: Tympanic membrane, ear canal and external ear normal.     Left Ear:  Tympanic membrane, ear canal and external ear normal.     Mouth/Throat:     Mouth: Mucous membranes are moist.     Pharynx: Oropharynx is clear. No oropharyngeal exudate or posterior oropharyngeal erythema.  Eyes:     General: No scleral icterus.    Extraocular Movements: Extraocular movements intact.     Conjunctiva/sclera: Conjunctivae normal.     Pupils: Pupils are equal, round, and reactive to light.  Cardiovascular:     Rate and Rhythm: Normal rate and regular rhythm.  Pulmonary:     Effort: Pulmonary effort is normal.     Breath sounds: Normal breath sounds.  Abdominal:     General: Bowel sounds are normal.  Musculoskeletal:     Cervical back: No rigidity or tenderness.  Lymphadenopathy:     Cervical: No cervical adenopathy.  Skin:    General: Skin is warm and dry.  Neurological:     Mental Status: He is alert and oriented to person, place, and time.  Psychiatric:        Mood and Affect: Mood normal.        Behavior: Behavior normal.     BP 118/70   Pulse 87   Temp (!) 97.4 F (36.3 C) (Temporal)   Ht 6\' 1"  (1.854 m)   Wt 219 lb 6.4 oz (99.5 kg)   SpO2 97%   BMI 28.95 kg/m  Wt Readings from Last 3 Encounters:  11/21/20 219 lb 6.4 oz (99.5 kg)  02/05/20 220 lb 6.4 oz (100 kg)  10/08/19 221 lb 9.6 oz (100.5 kg)     There are no preventive care reminders to display for this patient.  There are no preventive care reminders to display for this patient.  Lab Results  Component Value Date   TSH 1.13 01/28/2018   Lab Results  Component Value Date   WBC 6.7 02/05/2020   HGB 15.3 02/05/2020   HCT 45.9 02/05/2020   MCV 88.9 02/05/2020   PLT 341.0 02/05/2020   Lab Results  Component Value Date   NA 139 02/05/2020   K 4.3 02/05/2020   CO2 28 02/05/2020   GLUCOSE 89 02/05/2020   BUN 11 02/05/2020   CREATININE 0.81 02/05/2020   BILITOT 0.8 02/05/2020   ALKPHOS 40 02/05/2020   AST 20 02/05/2020   ALT 37 02/05/2020   PROT 6.9 02/05/2020   ALBUMIN 4.9  02/05/2020   CALCIUM 10.1 02/05/2020   GFR 105.84 02/05/2020   Lab Results  Component Value Date   CHOL 158 02/05/2020   Lab Results  Component Value Date   HDL 37 (L) 02/05/2020   Lab Results  Component Value Date   LDLCALC 92 02/05/2020   Lab Results  Component Value Date  TRIG 164 (H) 02/05/2020   Lab Results  Component Value Date   CHOLHDL 5 01/28/2018   No results found for: HGBA1C    Assessment & Plan:   Problem List Items Addressed This Visit       Cardiovascular and Mediastinum   Essential hypertension - Primary   Relevant Medications   lisinopril (ZESTRIL) 10 MG tablet   Other Relevant Orders   Basic metabolic panel     Respiratory   Moderate persistent asthma with acute exacerbation   Relevant Medications   Fluticasone-Salmeterol (WIXELA INHUB) 250-50 MCG/DOSE AEPB     Digestive   Gastroesophageal reflux disease   Relevant Medications   pantoprazole (PROTONIX) 40 MG tablet     Nervous and Auditory   Ulnar neuropathy of left upper extremity       Meds ordered this encounter  Medications   lisinopril (ZESTRIL) 10 MG tablet    Sig: Take 1 tablet (10 mg total) by mouth daily.    Dispense:  90 tablet    Refill:  4   Fluticasone-Salmeterol (WIXELA INHUB) 250-50 MCG/DOSE AEPB    Sig: USE 1 PUFF INTO THE LUNGS EVERY 12 HOURS    Dispense:  60 each    Refill:  4   pantoprazole (PROTONIX) 40 MG tablet    Sig: Take 1 tablet (40 mg total) by mouth daily.    Dispense:  90 tablet    Refill:  4    Follow-up: Return in about 1 year (around 11/21/2021), or if symptoms worsen or fail to improve.  Continue all medicines as above.  Encouraged him to continue exercising.  Given information on asthma on managing hypertension  Mliss Sax, MD  2/3 addendum: frequent requests for albuterol.

## 2021-01-07 ENCOUNTER — Other Ambulatory Visit: Payer: Self-pay | Admitting: Family

## 2021-01-07 DIAGNOSIS — K219 Gastro-esophageal reflux disease without esophagitis: Secondary | ICD-10-CM

## 2021-02-23 ENCOUNTER — Other Ambulatory Visit: Payer: Self-pay | Admitting: Family Medicine

## 2021-02-23 DIAGNOSIS — J4541 Moderate persistent asthma with (acute) exacerbation: Secondary | ICD-10-CM

## 2021-07-02 ENCOUNTER — Other Ambulatory Visit: Payer: Self-pay | Admitting: Family Medicine

## 2021-07-02 DIAGNOSIS — J4541 Moderate persistent asthma with (acute) exacerbation: Secondary | ICD-10-CM

## 2021-08-10 ENCOUNTER — Other Ambulatory Visit: Payer: Self-pay | Admitting: Family Medicine

## 2021-08-10 DIAGNOSIS — J4541 Moderate persistent asthma with (acute) exacerbation: Secondary | ICD-10-CM

## 2021-10-05 ENCOUNTER — Other Ambulatory Visit: Payer: Self-pay | Admitting: Family Medicine

## 2021-10-05 DIAGNOSIS — J4541 Moderate persistent asthma with (acute) exacerbation: Secondary | ICD-10-CM

## 2021-11-08 ENCOUNTER — Other Ambulatory Visit: Payer: Self-pay | Admitting: Family Medicine

## 2021-11-08 DIAGNOSIS — J4541 Moderate persistent asthma with (acute) exacerbation: Secondary | ICD-10-CM

## 2021-11-22 ENCOUNTER — Encounter: Payer: Self-pay | Admitting: Family Medicine

## 2021-11-22 ENCOUNTER — Ambulatory Visit (INDEPENDENT_AMBULATORY_CARE_PROVIDER_SITE_OTHER): Payer: BC Managed Care – PPO | Admitting: Family Medicine

## 2021-11-22 VITALS — BP 118/74 | HR 85 | Temp 97.9°F | Ht 73.0 in | Wt 226.0 lb

## 2021-11-22 DIAGNOSIS — L739 Follicular disorder, unspecified: Secondary | ICD-10-CM | POA: Diagnosis not present

## 2021-11-22 DIAGNOSIS — Z0000 Encounter for general adult medical examination without abnormal findings: Secondary | ICD-10-CM

## 2021-11-22 DIAGNOSIS — J4540 Moderate persistent asthma, uncomplicated: Secondary | ICD-10-CM | POA: Diagnosis not present

## 2021-11-22 DIAGNOSIS — I10 Essential (primary) hypertension: Secondary | ICD-10-CM | POA: Diagnosis not present

## 2021-11-22 DIAGNOSIS — L853 Xerosis cutis: Secondary | ICD-10-CM | POA: Diagnosis not present

## 2021-11-22 DIAGNOSIS — F439 Reaction to severe stress, unspecified: Secondary | ICD-10-CM

## 2021-11-22 LAB — URINALYSIS
Bilirubin Urine: NEGATIVE
Hgb urine dipstick: NEGATIVE
Ketones, ur: NEGATIVE
Leukocytes,Ua: NEGATIVE
Nitrite: NEGATIVE
Specific Gravity, Urine: 1.02 (ref 1.000–1.030)
Total Protein, Urine: NEGATIVE
Urine Glucose: NEGATIVE
Urobilinogen, UA: 0.2 (ref 0.0–1.0)
pH: 6 (ref 5.0–8.0)

## 2021-11-22 LAB — LIPID PANEL
Cholesterol: 160 mg/dL (ref 0–200)
HDL: 35.8 mg/dL — ABNORMAL LOW (ref >39.00)
NonHDL: 123.98
Total CHOL/HDL Ratio: 4
Triglycerides: 206 mg/dL — ABNORMAL HIGH (ref 0.0–149.0)
VLDL: 41.2 mg/dL — ABNORMAL HIGH (ref 0.0–40.0)

## 2021-11-22 LAB — CBC WITH DIFFERENTIAL/PLATELET
Basophils Absolute: 0.1 10*3/uL (ref 0.0–0.1)
Basophils Relative: 1.4 % (ref 0.0–3.0)
Eosinophils Absolute: 0.4 10*3/uL (ref 0.0–0.7)
Eosinophils Relative: 6.4 % — ABNORMAL HIGH (ref 0.0–5.0)
HCT: 45.8 % (ref 39.0–52.0)
Hemoglobin: 15.4 g/dL (ref 13.0–17.0)
Lymphocytes Relative: 28.6 % (ref 12.0–46.0)
Lymphs Abs: 1.9 10*3/uL (ref 0.7–4.0)
MCHC: 33.7 g/dL (ref 30.0–36.0)
MCV: 88.8 fl (ref 78.0–100.0)
Monocytes Absolute: 0.6 10*3/uL (ref 0.1–1.0)
Monocytes Relative: 8.5 % (ref 3.0–12.0)
Neutro Abs: 3.7 10*3/uL (ref 1.4–7.7)
Neutrophils Relative %: 55.1 % (ref 43.0–77.0)
Platelets: 340 10*3/uL (ref 150.0–400.0)
RBC: 5.16 Mil/uL (ref 4.22–5.81)
RDW: 14.1 % (ref 11.5–15.5)
WBC: 6.7 10*3/uL (ref 4.0–10.5)

## 2021-11-22 LAB — COMPREHENSIVE METABOLIC PANEL
ALT: 41 U/L (ref 0–53)
AST: 22 U/L (ref 0–37)
Albumin: 4.9 g/dL (ref 3.5–5.2)
Alkaline Phosphatase: 40 U/L (ref 39–117)
BUN: 11 mg/dL (ref 6–23)
CO2: 28 mEq/L (ref 19–32)
Calcium: 10.1 mg/dL (ref 8.4–10.5)
Chloride: 104 mEq/L (ref 96–112)
Creatinine, Ser: 0.82 mg/dL (ref 0.40–1.50)
GFR: 109.3 mL/min (ref >60.00)
Glucose, Bld: 90 mg/dL (ref 70–99)
Potassium: 4.2 mEq/L (ref 3.5–5.1)
Sodium: 139 mEq/L (ref 135–145)
Total Bilirubin: 0.7 mg/dL (ref 0.2–1.2)
Total Protein: 7.2 g/dL (ref 6.0–8.3)

## 2021-11-22 LAB — HEMOGLOBIN A1C: Hgb A1c MFr Bld: 5.6 % (ref 4.6–6.5)

## 2021-11-22 LAB — LDL CHOLESTEROL, DIRECT: Direct LDL: 105 mg/dL

## 2021-11-22 MED ORDER — DOXYCYCLINE HYCLATE 100 MG PO TABS
100.0000 mg | ORAL_TABLET | Freq: Two times a day (BID) | ORAL | 0 refills | Status: AC
Start: 2021-11-22 — End: 2021-12-02

## 2021-11-22 MED ORDER — HYDROXYZINE PAMOATE 25 MG PO CAPS: 25.0000 mg | ORAL_CAPSULE | Freq: Three times a day (TID) | ORAL | 0 refills | Status: DC | PRN

## 2021-11-22 NOTE — Progress Notes (Signed)
? ?Established Patient Office Visit ? ?Subjective:  ?Patient ID: Jeffrey Carrillo, male    DOB: [birth year]  Age: 41 y.o. MRN: [id number] ? ?CC:  ?Chief Complaint  ?Patient presents with  ? Annual Exam  ?  CPE, check rash on body very itchy x 2 months.   ? ? ?HPI ?Maisie Fushomas Blumstein presents for yearly physical and follow-up of hypertension and asthma.  Blood pressure well controlled with lisinopril.  Asthma is well controlled with Advair and as needed albuterol.  Rarely needs his albuterol.  He is stressed currently due to work and finances.  He knows that it is situational and it will pass.  Has been dealing with pruritus.  Has been seeing some pimples.  He has changed soaps and it has not helped much.  Wife is not affected. ? ?Past Medical History:  ?Diagnosis Date  ? Allergy   ? Asthma   ? GERD (gastroesophageal reflux disease)   ? HTN (hypertension)   ? ? ?Past Surgical History:  ?Procedure Laterality Date  ? NO PAST SURGERIES    ? ? ?Family History  ?Problem Relation Age of Onset  ? Hypertension Mother   ? Diabetes Father   ? Hearing loss Father   ? Heart attack Father   ? Kidney disease Father   ? Stroke Maternal Grandmother   ? Depression Maternal Grandmother   ? Heart attack Maternal Grandfather   ? Lung cancer Paternal Grandfather   ? ? ?Social History  ? ?Socioeconomic History  ? Marital status: Married  ?  Spouse name: Not on file  ? Number of children: 2  ? Years of education: Not on file  ? Highest education level: Not on file  ?Occupational History  ? Not on file  ?Tobacco Use  ? Smoking status: Former  ?  Types: Cigarettes  ?  Start date: 05/08/2012  ?  Quit date: 06/2017  ?  Years since quitting: 4.4  ? Smokeless tobacco: Never  ?Substance and Sexual Activity  ? Alcohol use: No  ? Drug use: No  ? Sexual activity: Yes  ?  Birth control/protection: Surgical  ?Other Topics Concern  ? Not on file  ?Social History Narrative  ? Not on file  ? ?Social Determinants of Health  ? ?Financial Resource Strain: Not on  file  ?Food Insecurity: Not on file  ?Transportation Needs: Not on file  ?Physical Activity: Not on file  ?Stress: Not on file  ?Social Connections: Not on file  ?Intimate Partner Violence: Not on file  ? ? ?Outpatient Medications Prior to Visit  ?Medication Sig Dispense Refill  ? albuterol (VENTOLIN HFA) 108 (90 Base) MCG/ACT inhaler INHALE 1 PUFF INTO THE LUNGS EVERY 6 HOURS AS NEEDED FOR WHEEZING OR SHORTNESS OF BREATH 8.5 g 0  ? Fluticasone-Salmeterol (WIXELA INHUB) 250-50 MCG/DOSE AEPB USE 1 PUFF INTO THE LUNGS EVERY 12 HOURS 60 each 4  ? lisinopril (ZESTRIL) 10 MG tablet Take 1 tablet (10 mg total) by mouth daily. 90 tablet 4  ? pantoprazole (PROTONIX) 40 MG tablet Take 1 tablet (40 mg total) by mouth daily. 90 tablet 4  ? Omega-3 Fatty Acids (FISH OIL) 1200 MG CAPS Take 1 capsule by mouth daily. (Patient not taking: Reported on 11/21/2020)    ? ?No facility-administered medications prior to visit.  ? ? ?Allergies  ?Allergen Reactions  ? Codeine Nausea And Vomiting  ? ? ?ROS ?Review of Systems  ?Constitutional: Negative.   ?HENT: Negative.    ?Eyes:  Negative for photophobia  and visual disturbance.  ?Respiratory: Negative.    ?Cardiovascular: Negative.   ?Gastrointestinal: Negative.   ?Endocrine: Negative for polyphagia and polyuria.  ?Genitourinary: Negative.   ?Musculoskeletal:  Negative for gait problem and joint swelling.  ?Skin:  Positive for rash. Negative for color change.  ?Neurological:  Negative for speech difficulty, weakness and light-headedness.  ? ?  ? ?  11/22/2021  ?  8:45 AM 11/21/2020  ?  9:05 AM 02/05/2020  ?  9:37 AM  ?Depression screen PHQ 2/9  ?Decreased Interest 0 0 0  ?Down, Depressed, Hopeless 0 0 0  ?PHQ - 2 Score 0 0 0  ?Altered sleeping   0  ?Tired, decreased energy   1  ?Change in appetite   0  ?Feeling bad or failure about yourself    0  ?Trouble concentrating   0  ?Moving slowly or fidgety/restless   0  ?Suicidal thoughts   0  ?PHQ-9 Score   1  ?Difficult doing work/chores   Not  difficult at all  ? ? ? ?Objective:  ?  ?Physical Exam ?Vitals and nursing note reviewed.  ?Constitutional:   ?   General: He is not in acute distress. ?   Appearance: Normal appearance. He is not ill-appearing, toxic-appearing or diaphoretic.  ?HENT:  ?   Head: Normocephalic and atraumatic.  ?   Right Ear: External ear normal.  ?   Left Ear: External ear normal.  ?   Mouth/Throat:  ?   Mouth: Mucous membranes are moist.  ?   Pharynx: Oropharynx is clear. No oropharyngeal exudate or posterior oropharyngeal erythema.  ?Eyes:  ?   General: No scleral icterus.    ?   Right eye: No discharge.     ?   Left eye: No discharge.  ?   Extraocular Movements: Extraocular movements intact.  ?   Conjunctiva/sclera: Conjunctivae normal.  ?   Pupils: Pupils are equal, round, and reactive to light.  ?Cardiovascular:  ?   Rate and Rhythm: Normal rate and regular rhythm.  ?Pulmonary:  ?   Effort: Pulmonary effort is normal.  ?   Breath sounds: Normal breath sounds.  ?Abdominal:  ?   General: Abdomen is flat. Bowel sounds are normal. There is no distension.  ?   Palpations: There is no mass.  ?   Tenderness: There is no abdominal tenderness. There is no guarding or rebound.  ?   Hernia: No hernia is present. There is no hernia in the left inguinal area or right inguinal area.  ?Genitourinary: ?   Penis: Circumcised. No hypospadias, erythema, tenderness, discharge, swelling or lesions.   ?   Testes:     ?   Right: Mass, tenderness or swelling not present. Right testis is descended.     ?   Left: Mass, tenderness or swelling not present. Left testis is descended.  ?   Epididymis:  ?   Right: Not inflamed or enlarged.  ?   Left: Not inflamed or enlarged.  ?Musculoskeletal:  ?   Cervical back: No rigidity or tenderness.  ?Lymphadenopathy:  ?   Cervical: No cervical adenopathy.  ?   Lower Body: No right inguinal adenopathy. No left inguinal adenopathy.  ?Skin: ?   General: Skin is warm and dry.  ? ?    ?Neurological:  ?   General: No focal  deficit present.  ?   Mental Status: He is alert and oriented to person, place, and time.  ?Psychiatric:     ?  Mood and Affect: Mood normal.     ?   Behavior: Behavior normal.  ? ? ?BP 118/74 (BP Location: Right Arm, Patient Position: Sitting, Cuff Size: Large)   Pulse 85   Temp 97.9 ?F (36.6 ?C) (Temporal)   Ht 6\' 1"  (1.854 m) Comment: 6 1 3/4  Wt 226 lb (102.5 kg)   SpO2 96%   BMI 29.82 kg/m?  ?Wt Readings from Last 3 Encounters:  ?11/22/21 226 lb (102.5 kg)  ?11/21/20 219 lb 6.4 oz (99.5 kg)  ?02/05/20 220 lb 6.4 oz (100 kg)  ? ? ? ?There are no preventive care reminders to display for this patient. ? ?There are no preventive care reminders to display for this patient. ? ?Lab Results  ?Component Value Date  ? TSH 1.13 01/28/2018  ? ?Lab Results  ?Component Value Date  ? WBC 6.7 02/05/2020  ? HGB 15.3 02/05/2020  ? HCT 45.9 02/05/2020  ? MCV 88.9 02/05/2020  ? PLT 341.0 02/05/2020  ? ?Lab Results  ?Component Value Date  ? NA 139 11/21/2020  ? K 4.5 11/21/2020  ? CO2 25 11/21/2020  ? GLUCOSE 90 11/21/2020  ? BUN 11 11/21/2020  ? CREATININE 0.80 11/21/2020  ? BILITOT 0.8 02/05/2020  ? ALKPHOS 40 02/05/2020  ? AST 20 02/05/2020  ? ALT 37 02/05/2020  ? PROT 6.9 02/05/2020  ? ALBUMIN 4.9 02/05/2020  ? CALCIUM 10.2 11/21/2020  ? GFR 110.90 11/21/2020  ? ?Lab Results  ?Component Value Date  ? CHOL 158 02/05/2020  ? ?Lab Results  ?Component Value Date  ? HDL 37 (L) 02/05/2020  ? ?Lab Results  ?Component Value Date  ? LDLCALC 92 02/05/2020  ? ?Lab Results  ?Component Value Date  ? TRIG 164 (H) 02/05/2020  ? ?Lab Results  ?Component Value Date  ? CHOLHDL 5 01/28/2018  ? ?No results found for: HGBA1C ? ?  ?Assessment & Plan:  ? ?Problem List Items Addressed This Visit   ? ?  ? Cardiovascular and Mediastinum  ? Essential hypertension - Primary  ? Relevant Orders  ? CBC with Differential/Platelet  ? Comprehensive metabolic panel  ? Urinalysis  ?  ? Respiratory  ? Moderate persistent asthma without complication  ?  ?  Other  ? Healthcare maintenance  ? Relevant Orders  ? Hemoglobin A1c  ? Lipid panel  ? ?Other Visit Diagnoses   ? ? Xerosis cutis      ? Relevant Medications  ? hydrOXYzine (VISTARIL) 25 MG capsule  ? Folliculiti

## 2021-12-16 ENCOUNTER — Other Ambulatory Visit: Payer: Self-pay | Admitting: Family Medicine

## 2021-12-16 DIAGNOSIS — J4540 Moderate persistent asthma, uncomplicated: Secondary | ICD-10-CM

## 2021-12-16 DIAGNOSIS — J4541 Moderate persistent asthma with (acute) exacerbation: Secondary | ICD-10-CM

## 2021-12-16 DIAGNOSIS — I10 Essential (primary) hypertension: Secondary | ICD-10-CM

## 2021-12-16 DIAGNOSIS — K219 Gastro-esophageal reflux disease without esophagitis: Secondary | ICD-10-CM

## 2022-02-02 ENCOUNTER — Other Ambulatory Visit: Payer: Self-pay | Admitting: Family Medicine

## 2022-02-02 DIAGNOSIS — J4541 Moderate persistent asthma with (acute) exacerbation: Secondary | ICD-10-CM

## 2022-02-05 ENCOUNTER — Encounter: Payer: Self-pay | Admitting: Family Medicine

## 2022-02-05 ENCOUNTER — Telehealth (INDEPENDENT_AMBULATORY_CARE_PROVIDER_SITE_OTHER): Payer: Managed Care, Other (non HMO) | Admitting: Family Medicine

## 2022-02-05 DIAGNOSIS — L853 Xerosis cutis: Secondary | ICD-10-CM | POA: Diagnosis not present

## 2022-02-05 DIAGNOSIS — F418 Other specified anxiety disorders: Secondary | ICD-10-CM

## 2022-02-05 MED ORDER — HYDROXYZINE PAMOATE 25 MG PO CAPS: 25.0000 mg | ORAL_CAPSULE | Freq: Three times a day (TID) | ORAL | 0 refills | Status: DC | PRN

## 2022-02-05 MED ORDER — SERTRALINE HCL 50 MG PO TABS: 50.0000 mg | ORAL_TABLET | Freq: Every day | ORAL | 2 refills | Status: DC

## 2022-02-05 NOTE — Progress Notes (Signed)
Established Patient Office Visit  Subjective   Patient ID: Jeffrey Carrillo, male    DOB: [birth year]  Age: 41 y.o. MRN: [id number]  Chief Complaint  Patient presents with   Anxiety    Issues with anxiety seems to worry about everything. Symptoms going on for years.     Anxiety Symptoms include nervous/anxious behavior. Patient reports no insomnia or suicidal ideas.    for evaluation of ongoing feelings of anxiety with sadness.  Wife believes that it is worsening of late.  He feels stressed at home and at work and worries a lot.  Little things that should not bother him tend to affect him more than he should, he feels.  Feels frustrated.  Feels sad.  Works [employer] in [employer].  Things are mostly okay at home.  Wife is supportive.  Does not drink alcohol or use illicit drugs.  He is not exercising.  Family dog passed this past February.  His new gaming station had arrived on the day of his dog's death.  And has barely used it since.    Review of Systems  Constitutional: Negative.   HENT: Negative.    Eyes:  Negative for blurred vision, discharge and redness.  Respiratory: Negative.    Cardiovascular: Negative.   Gastrointestinal:  Negative for abdominal pain.  Genitourinary: Negative.   Musculoskeletal: Negative.  Negative for myalgias.  Skin:  Negative for rash.  Neurological:  Negative for tingling, loss of consciousness and weakness.  Endo/Heme/Allergies:  Negative for polydipsia.  Psychiatric/Behavioral:  Positive for depression. Negative for memory loss, substance abuse and suicidal ideas. The patient is nervous/anxious. The patient does not have insomnia.      02/05/2022    3:07 PM 11/22/2021    9:43 AM 11/22/2021    8:45 AM  Depression screen PHQ 2/9  Decreased Interest 2 1 0  Down, Depressed, Hopeless 1 1 0  PHQ - 2 Score 3 2 0  Altered sleeping 0 2   Tired, decreased energy 3 2   Change in appetite 3 2   Feeling bad or failure about yourself  2 1   Trouble concentrating 3  0   Moving slowly or fidgety/restless 1 0   Suicidal thoughts 0 0   PHQ-9 Score 15 9   Difficult doing work/chores Very difficult Not difficult at all        Objective:     There were no vitals taken for this visit. BP Readings from Last 3 Encounters:  11/22/21 118/74  11/21/20 118/70  02/05/20 120/78   Wt Readings from Last 3 Encounters:  11/22/21 226 lb (102.5 kg)  11/21/20 219 lb 6.4 oz (99.5 kg)  02/05/20 220 lb 6.4 oz (100 kg)      Physical Exam Constitutional:      General: He is not in acute distress.    Appearance: Normal appearance. He is not ill-appearing, toxic-appearing or diaphoretic.  HENT:     Head: Normocephalic and atraumatic.     Right Ear: External ear normal.     Left Ear: External ear normal.  Eyes:     General: No scleral icterus.       Right eye: No discharge.        Left eye: No discharge.     Extraocular Movements: Extraocular movements intact.     Conjunctiva/sclera: Conjunctivae normal.  Pulmonary:     Effort: Pulmonary effort is normal.  Skin:    General: Skin is warm and dry.  Neurological:  Mental Status: He is alert and oriented to person, place, and time.  Psychiatric:        Attention and Perception: Attention and perception normal.        Mood and Affect: Mood normal.        Behavior: Behavior normal.        Thought Content: Thought content normal.        Cognition and Memory: Cognition normal.     No results found for any visits on 02/05/22.    The 10-year ASCVD risk score (Arnett DK, et al., 2019) is: 1.3%    Assessment & Plan:   Problem List Items Addressed This Visit   None Visit Diagnoses     Anxiety with depression    -  Primary   Relevant Medications   hydrOXYzine (VISTARIL) 25 MG capsule   sertraline (ZOLOFT) 50 MG tablet   Xerosis cutis       Relevant Medications   hydrOXYzine (VISTARIL) 25 MG capsule       Return in about 6 weeks (around 03/19/2022).  Patient notes that the medication will  take time.  Discussed sexual side effects.  Libby Maw, MD  Virtual Visit via Video Note  I connected with Roxanne Dyck on 02/05/22 at  3:00 PM EDT by a video enabled telemedicine application and verified that I am speaking with the correct person using two identifiers.  Location: Patient: at work alone in an  office. Provider: work   I discussed the limitations of evaluation and management by telemedicine and the availability of in person appointments. The patient expressed understanding and agreed to proceed.  History of Present Illness:    Observations/Objective:   Assessment and Plan:   Follow Up Instructions:    I discussed the assessment and treatment plan with the patient. The patient was provided an opportunity to ask questions and all were answered. The patient agreed with the plan and demonstrated an understanding of the instructions.   The patient was advised to call back or seek an in-person evaluation if the symptoms worsen or if the condition fails to improve as anticipated.  I provided 30 minutes of non-face-to-face time during this encounter.   Libby Maw, MD

## 2022-03-15 ENCOUNTER — Other Ambulatory Visit: Payer: Self-pay | Admitting: Family Medicine

## 2022-03-15 DIAGNOSIS — J4541 Moderate persistent asthma with (acute) exacerbation: Secondary | ICD-10-CM

## 2022-05-02 ENCOUNTER — Other Ambulatory Visit: Payer: Self-pay | Admitting: Family Medicine

## 2022-05-02 DIAGNOSIS — F418 Other specified anxiety disorders: Secondary | ICD-10-CM

## 2022-05-09 ENCOUNTER — Encounter: Payer: Self-pay | Admitting: Family Medicine

## 2022-05-09 ENCOUNTER — Ambulatory Visit (INDEPENDENT_AMBULATORY_CARE_PROVIDER_SITE_OTHER): Payer: Managed Care, Other (non HMO)

## 2022-05-09 ENCOUNTER — Ambulatory Visit: Payer: Managed Care, Other (non HMO) | Admitting: Family Medicine

## 2022-05-09 VITALS — BP 110/76 | HR 88 | Temp 97.1°F | Ht 73.0 in | Wt 235.6 lb

## 2022-05-09 DIAGNOSIS — M545 Low back pain, unspecified: Secondary | ICD-10-CM | POA: Diagnosis not present

## 2022-05-09 DIAGNOSIS — F418 Other specified anxiety disorders: Secondary | ICD-10-CM | POA: Diagnosis not present

## 2022-05-09 DIAGNOSIS — J4540 Moderate persistent asthma, uncomplicated: Secondary | ICD-10-CM

## 2022-05-09 DIAGNOSIS — I10 Essential (primary) hypertension: Secondary | ICD-10-CM | POA: Diagnosis not present

## 2022-05-09 DIAGNOSIS — Z23 Encounter for immunization: Secondary | ICD-10-CM | POA: Diagnosis not present

## 2022-05-09 DIAGNOSIS — Z0000 Encounter for general adult medical examination without abnormal findings: Secondary | ICD-10-CM | POA: Diagnosis not present

## 2022-05-09 DIAGNOSIS — L739 Follicular disorder, unspecified: Secondary | ICD-10-CM

## 2022-05-09 LAB — COMPREHENSIVE METABOLIC PANEL
ALT: 73 U/L — ABNORMAL HIGH (ref 0–53)
AST: 31 U/L (ref 0–37)
Albumin: 4.7 g/dL (ref 3.5–5.2)
Alkaline Phosphatase: 39 U/L (ref 39–117)
BUN: 14 mg/dL (ref 6–23)
CO2: 27 mEq/L (ref 19–32)
Calcium: 9.9 mg/dL (ref 8.4–10.5)
Chloride: 102 mEq/L (ref 96–112)
Creatinine, Ser: 0.78 mg/dL (ref 0.40–1.50)
GFR: 110.61 mL/min (ref >60.00)
Glucose, Bld: 86 mg/dL (ref 70–99)
Potassium: 4.3 mEq/L (ref 3.5–5.1)
Sodium: 137 mEq/L (ref 135–145)
Total Bilirubin: 0.6 mg/dL (ref 0.2–1.2)
Total Protein: 7.3 g/dL (ref 6.0–8.3)

## 2022-05-09 LAB — CBC
HCT: 44.9 % (ref 39.0–52.0)
Hemoglobin: 14.9 g/dL (ref 13.0–17.0)
MCHC: 33.3 g/dL (ref 30.0–36.0)
MCV: 88.7 fl (ref 78.0–100.0)
Platelets: 362 10*3/uL (ref 150.0–400.0)
RBC: 5.06 Mil/uL (ref 4.22–5.81)
RDW: 14.4 % (ref 11.5–15.5)
WBC: 7.4 10*3/uL (ref 4.0–10.5)

## 2022-05-09 LAB — URINALYSIS, ROUTINE W REFLEX MICROSCOPIC
Bilirubin Urine: NEGATIVE
Hgb urine dipstick: NEGATIVE
Ketones, ur: NEGATIVE
Leukocytes,Ua: NEGATIVE
Nitrite: NEGATIVE
RBC / HPF: NONE SEEN (ref 0–?)
Specific Gravity, Urine: 1.015 (ref 1.000–1.030)
Total Protein, Urine: NEGATIVE
Urine Glucose: NEGATIVE
Urobilinogen, UA: 0.2 (ref 0.0–1.0)
pH: 7.5 (ref 5.0–8.0)

## 2022-05-09 LAB — LIPID PANEL
Cholesterol: 172 mg/dL (ref 0–200)
HDL: 42.7 mg/dL (ref >39.00)
LDL Cholesterol: 96 mg/dL (ref 0–99)
NonHDL: 129.31
Total CHOL/HDL Ratio: 4
Triglycerides: 167 mg/dL — ABNORMAL HIGH (ref 0.0–149.0)
VLDL: 33.4 mg/dL (ref 0.0–40.0)

## 2022-05-09 MED ORDER — DOXYCYCLINE HYCLATE 100 MG PO TABS: 100.0000 mg | ORAL_TABLET | Freq: Two times a day (BID) | ORAL | 0 refills | Status: AC

## 2022-05-09 NOTE — Progress Notes (Unsigned)
Initial visit for lbp. Pt noticed pain while moving and lifting heavy furniture.

## 2022-05-09 NOTE — Progress Notes (Signed)
Established Patient Office Visit  Subjective   Patient ID: Jeffrey Carrillo, male    DOB: 05-17-1981  Age: 41 y.o. MRN: [id number]  Chief Complaint  Patient presents with   Annual Exam    CPE, no concerns. Soreness in lower back injured while moving. Patient fasting.     HPI for health check and follow-up of hypertension, asthma, anxiety with depression.  Blood pressure well controlled with Zestril.  Sertraline is definitely elevated his mood.  He feels much better on it things are going well for him personally.  As a new job.  He is in a new house.  Had a note to speak to him about his increased albuterol use.  He has only been taking his Advair once daily.  Low back pain after prednisone with counts last month.  Pain is midline and nonradiating.  There is no weakness tingling or change in his bowel or bladder function.  Slightly improving.  He has a pimple on his left upper eyelid slightly pends resolving.    Review of Systems  Constitutional: Negative.   HENT: Negative.    Eyes:  Negative for blurred vision, discharge and redness.  Respiratory:  Positive for wheezing. Negative for cough, sputum production and shortness of breath.   Cardiovascular: Negative.   Gastrointestinal:  Negative for abdominal pain.  Genitourinary: Negative.   Musculoskeletal:  Positive for back pain. Negative for myalgias.  Skin:  Negative for rash.  Neurological:  Negative for tingling, loss of consciousness and weakness.  Endo/Heme/Allergies:  Negative for polydipsia.      05/09/2022    9:29 AM 05/09/2022    8:59 AM 02/05/2022    3:07 PM  Depression screen PHQ 2/9  Decreased Interest 0 0 2  Down, Depressed, Hopeless 0 0 1  PHQ - 2 Score 0 0 3  Altered sleeping 0  0  Tired, decreased energy 1  3  Change in appetite 3  3  Feeling bad or failure about yourself  1  2  Trouble concentrating 0  3  Moving slowly or fidgety/restless 0  1  Suicidal thoughts 0  0  PHQ-9 Score 5  15  Difficult doing  work/chores Not difficult at all  Very difficult       Objective:     BP 110/76 (BP Location: Left Arm, Patient Position: Sitting, Cuff Size: Large)   Pulse 88   Temp (!) 97.1 F (36.2 C) (Temporal)   Ht 6\' 1"  (1.854 m)   Wt 235 lb 9.6 oz (106.9 kg)   SpO2 96%   BMI 31.08 kg/m    Physical Exam Constitutional:      General: He is not in acute distress.    Appearance: Normal appearance. He is not ill-appearing, toxic-appearing or diaphoretic.  HENT:     Head: Normocephalic and atraumatic.     Right Ear: External ear normal.     Left Ear: External ear normal.  Eyes:     General: No scleral icterus.       Right eye: No discharge.        Left eye: No discharge.     Extraocular Movements: Extraocular movements intact.     Conjunctiva/sclera: Conjunctivae normal.  Cardiovascular:     Rate and Rhythm: Normal rate and regular rhythm.  Pulmonary:     Effort: Pulmonary effort is normal. No respiratory distress.     Breath sounds: Normal breath sounds.  Abdominal:     General: Bowel sounds are normal.  Musculoskeletal:  Lumbar back: No bony tenderness. Normal range of motion. Negative right straight leg raise test and negative left straight leg raise test.  Skin:    General: Skin is warm and dry.       Neurological:     Mental Status: He is alert and oriented to person, place, and time.     Motor: No weakness.     Deep Tendon Reflexes:     Reflex Scores:      Patellar reflexes are 2+ on the right side and 2+ on the left side.      Achilles reflexes are 1+ on the right side and 1+ on the left side. Psychiatric:        Mood and Affect: Mood normal.        Behavior: Behavior normal.      No results found for any visits on 05/09/22.    The 10-year ASCVD risk score (Arnett DK, et al., 2019) is: 1.2%    Assessment & Plan:   Problem List Items Addressed This Visit       Cardiovascular and Mediastinum   Essential hypertension   Relevant Orders   CBC    Comprehensive metabolic panel     Respiratory   Moderate persistent asthma without complication     Other   Healthcare maintenance - Primary   Relevant Orders   CBC   Comprehensive metabolic panel   Lipid panel   Urinalysis, Routine w reflex microscopic   Midline low back pain without sciatica   Relevant Orders   DG Lumbar Spine Complete   Other Visit Diagnoses     Anxiety with depression       Folliculitis       Relevant Medications   doxycycline (VIBRA-TABS) 100 MG tablet   Need for influenza vaccination       Relevant Orders   Flu Vaccine QUAD 6+ mos PF IM (Fluarix Quad PF) (Completed)       Return in about 6 months (around 11/07/2022), or if symptoms worsen or fail to improve.  Dems.  Proper lifting technique and was given back exercises to do.  Checking lumbar films.  Stressed the importance of using the Advair twice daily to control his asthma.  Discussed that increased albuterol use indicates that asthma is not well controlled.  Reminded him to rinse after using the Advair.  Continue Zestril and sertraline.  Mliss Sax, MD

## 2022-05-16 ENCOUNTER — Other Ambulatory Visit: Payer: Self-pay | Admitting: Family Medicine

## 2022-05-16 DIAGNOSIS — J4541 Moderate persistent asthma with (acute) exacerbation: Secondary | ICD-10-CM

## 2022-05-17 ENCOUNTER — Other Ambulatory Visit: Payer: Self-pay | Admitting: Family Medicine

## 2022-05-17 DIAGNOSIS — J4541 Moderate persistent asthma with (acute) exacerbation: Secondary | ICD-10-CM

## 2022-05-18 MED ORDER — ALBUTEROL SULFATE HFA 108 (90 BASE) MCG/ACT IN AERS: 1.0000 | INHALATION_SPRAY | Freq: Four times a day (QID) | RESPIRATORY_TRACT | 2 refills | Status: DC | PRN

## 2022-06-01 ENCOUNTER — Other Ambulatory Visit: Payer: Self-pay

## 2022-06-01 DIAGNOSIS — F418 Other specified anxiety disorders: Secondary | ICD-10-CM

## 2022-06-01 DIAGNOSIS — I10 Essential (primary) hypertension: Secondary | ICD-10-CM

## 2022-06-01 DIAGNOSIS — J4541 Moderate persistent asthma with (acute) exacerbation: Secondary | ICD-10-CM

## 2022-06-01 MED ORDER — LISINOPRIL 10 MG PO TABS: ORAL_TABLET | ORAL | 2 refills | Status: DC

## 2022-06-01 MED ORDER — SERTRALINE HCL 50 MG PO TABS: ORAL_TABLET | ORAL | 1 refills | Status: DC

## 2022-06-01 NOTE — Telephone Encounter (Signed)
Spoke to patient and he is switching to [employer].  Can he get the Sertraline 50 mg, Albuterol, and Lisinopril sent to them.   Please review and advise.   Thanks. Dm/cma

## 2022-11-07 ENCOUNTER — Ambulatory Visit: Payer: Managed Care, Other (non HMO) | Admitting: Family Medicine

## 2022-11-12 ENCOUNTER — Other Ambulatory Visit: Payer: Self-pay | Admitting: Family Medicine

## 2022-11-12 DIAGNOSIS — F418 Other specified anxiety disorders: Secondary | ICD-10-CM

## 2022-11-20 ENCOUNTER — Other Ambulatory Visit: Payer: Self-pay | Admitting: Family Medicine

## 2022-11-20 DIAGNOSIS — J4541 Moderate persistent asthma with (acute) exacerbation: Secondary | ICD-10-CM

## 2022-11-21 ENCOUNTER — Ambulatory Visit: Payer: Managed Care, Other (non HMO) | Admitting: Family Medicine

## 2022-11-22 ENCOUNTER — Ambulatory Visit: Payer: Managed Care, Other (non HMO) | Admitting: Family Medicine

## 2022-11-22 ENCOUNTER — Encounter: Payer: Self-pay | Admitting: Family Medicine

## 2022-11-22 VITALS — BP 128/74 | HR 95 | Temp 98.8°F | Ht 73.0 in | Wt 249.4 lb

## 2022-11-22 DIAGNOSIS — F418 Other specified anxiety disorders: Secondary | ICD-10-CM | POA: Diagnosis not present

## 2022-11-22 DIAGNOSIS — R7401 Elevation of levels of liver transaminase levels: Secondary | ICD-10-CM

## 2022-11-22 DIAGNOSIS — J4540 Moderate persistent asthma, uncomplicated: Secondary | ICD-10-CM

## 2022-11-22 DIAGNOSIS — E782 Mixed hyperlipidemia: Secondary | ICD-10-CM

## 2022-11-22 DIAGNOSIS — T887XXA Unspecified adverse effect of drug or medicament, initial encounter: Secondary | ICD-10-CM

## 2022-11-22 MED ORDER — ICOSAPENT ETHYL 0.5 G PO CAPS: 4.0000 | ORAL_CAPSULE | Freq: Two times a day (BID) | ORAL | 1 refills | Status: DC

## 2022-11-22 MED ORDER — FLUOXETINE HCL 10 MG PO CAPS: 10.0000 mg | ORAL_CAPSULE | Freq: Every day | ORAL | 3 refills | Status: DC

## 2022-11-22 NOTE — Progress Notes (Signed)
Established Patient Office Visit   Subjective:  Patient ID: Jeffrey Carrillo, male    DOB: 27-Sep-1980  Age: 42 y.o. MRN: [id number]  Chief Complaint  Patient presents with   Medical Management of Chronic Issues    6 month follow up, would like to change Zoloft to something different. Patient not fasting.     HPI Encounter Diagnoses  Name Primary?   Moderate persistent asthma without complication Yes   Anxiety with depression    Medication side effect    Elevated triglycerides with high cholesterol    Elevated ALT measurement    Follow-up of above.  Zoloft has led to delayed orgasm.  Would like to try different medication.  Has only been using the Wixela once daily.  This is led to increased rescue inhaler use.  Dietary indiscretion with weight gain status post COVID infection.  History of hepatic steatosis.   Review of Systems  Constitutional: Negative.   HENT: Negative.    Eyes:  Negative for blurred vision, discharge and redness.  Respiratory: Negative.    Cardiovascular: Negative.   Gastrointestinal:  Negative for abdominal pain.  Genitourinary: Negative.   Musculoskeletal: Negative.  Negative for myalgias.  Skin:  Negative for rash.  Neurological:  Negative for tingling, loss of consciousness and weakness.  Endo/Heme/Allergies:  Negative for polydipsia.      11/22/2022   10:10 AM 05/09/2022    9:29 AM 05/09/2022    8:59 AM  Depression screen PHQ 2/9  Decreased Interest 0 0 0  Down, Depressed, Hopeless 0 0 0  PHQ - 2 Score 0 0 0  Altered sleeping  0   Tired, decreased energy  1   Change in appetite  3   Feeling bad or failure about yourself   1   Trouble concentrating  0   Moving slowly or fidgety/restless  0   Suicidal thoughts  0   PHQ-9 Score  5   Difficult doing work/chores  Not difficult at all        Current Outpatient Medications:    albuterol (VENTOLIN HFA) 108 (90 Base) MCG/ACT inhaler, INHALE 1 PUFF INTO THE LUNGS EVERY 6 HOURS AS NEEDED FOR  WHEEZING OR SHORTNESS OF BREATH, Disp: 8.5 g, Rfl: 0   FLUoxetine (PROZAC) 10 MG capsule, Take 1 capsule (10 mg total) by mouth daily., Disp: 90 capsule, Rfl: 3   Icosapent Ethyl (VASCEPA) 0.5 g CAPS, Take 4 capsules (2 g total) by mouth 2 (two) times daily., Disp: 240 capsule, Rfl: 1   lisinopril (ZESTRIL) 10 MG tablet, TAKE 1 TABLET(10 MG) BY MOUTH DAILY., Disp: 90 tablet, Rfl: 2   pantoprazole (PROTONIX) 40 MG tablet, TAKE 1 TABLET(40 MG) BY MOUTH DAILY, Disp: 90 tablet, Rfl: 4   WIXELA INHUB 250-50 MCG/ACT AEPB, INHALE 1 PUFF INTO THE LUNGS EVERY 12 HOURS, Disp: 60 each, Rfl: 4   Objective:     BP 128/74 (BP Location: Right Arm, Patient Position: Sitting, Cuff Size: Large)   Pulse 95   Temp 98.8 F (37.1 C) (Temporal)   Ht 6\' 1"  (1.854 m)   Wt 249 lb 6.4 oz (113.1 kg)   SpO2 96%   BMI 32.90 kg/m  Wt Readings from Last 3 Encounters:  11/22/22 249 lb 6.4 oz (113.1 kg)  05/09/22 235 lb 9.6 oz (106.9 kg)  11/22/21 226 lb (102.5 kg)      Physical Exam Constitutional:      General: He is not in acute distress.    Appearance: Normal  appearance. He is not ill-appearing, toxic-appearing or diaphoretic.  HENT:     Head: Normocephalic and atraumatic.     Right Ear: External ear normal.     Left Ear: External ear normal.  Eyes:     General: No scleral icterus.       Right eye: No discharge.        Left eye: No discharge.     Extraocular Movements: Extraocular movements intact.     Conjunctiva/sclera: Conjunctivae normal.  Cardiovascular:     Rate and Rhythm: Normal rate and regular rhythm.  Pulmonary:     Effort: Pulmonary effort is normal. No respiratory distress.     Breath sounds: Normal breath sounds. No wheezing, rhonchi or rales.  Musculoskeletal:     Cervical back: No rigidity or tenderness.  Skin:    General: Skin is warm and dry.  Neurological:     Mental Status: He is alert and oriented to person, place, and time.  Psychiatric:        Mood and Affect: Mood  normal.        Behavior: Behavior normal.      No results found for any visits on 11/22/22.    The 10-year ASCVD risk score (Arnett DK, et al., 2019) is: 1.6%    Assessment & Plan:   Moderate persistent asthma without complication  Anxiety with depression -     FLUoxetine HCl; Take 1 capsule (10 mg total) by mouth daily.  Dispense: 90 capsule; Refill: 3  Medication side effect  Elevated triglycerides with high cholesterol -     Icosapent Ethyl; Take 4 capsules (2 g total) by mouth 2 (two) times daily.  Dispense: 240 capsule; Refill: 1  Elevated ALT measurement    Return in about 8 weeks (around 01/17/2023), or if symptoms worsen or fail to improve.  Patient will make a concerted effort to use his Wixela twice daily instead of once.  This should definitely decrease his need for the rescue inhaler.  Admits dietary indiscretion with increased weight after COVID infection.  Again expressed concern about elevated triglycerides and elevated ALT with regards to long-term liver health.  Could consider repeating ultrasound of liver.  Reports that fish oil causes a lot of burping.  He is interested in trying Vascepa.  Will switch to low-dose Prozac at 10 mg daily.  Follow-up in 6 to 8 weeks.  Libby Maw, MD

## 2022-11-26 ENCOUNTER — Encounter: Payer: BC Managed Care – PPO | Admitting: Family Medicine

## 2023-01-12 ENCOUNTER — Other Ambulatory Visit: Payer: Self-pay | Admitting: Family Medicine

## 2023-01-12 DIAGNOSIS — K219 Gastro-esophageal reflux disease without esophagitis: Secondary | ICD-10-CM

## 2023-01-17 ENCOUNTER — Encounter: Payer: Self-pay | Admitting: Family Medicine

## 2023-01-17 ENCOUNTER — Ambulatory Visit: Payer: Managed Care, Other (non HMO) | Admitting: Family Medicine

## 2023-01-17 VITALS — BP 122/78 | HR 84 | Temp 98.7°F | Ht 73.0 in | Wt 252.4 lb

## 2023-01-17 DIAGNOSIS — R7401 Elevation of levels of liver transaminase levels: Secondary | ICD-10-CM

## 2023-01-17 DIAGNOSIS — F418 Other specified anxiety disorders: Secondary | ICD-10-CM | POA: Diagnosis not present

## 2023-01-17 DIAGNOSIS — I10 Essential (primary) hypertension: Secondary | ICD-10-CM

## 2023-01-17 DIAGNOSIS — K7581 Nonalcoholic steatohepatitis (NASH): Secondary | ICD-10-CM | POA: Diagnosis not present

## 2023-01-17 DIAGNOSIS — E782 Mixed hyperlipidemia: Secondary | ICD-10-CM | POA: Diagnosis not present

## 2023-01-17 DIAGNOSIS — J4540 Moderate persistent asthma, uncomplicated: Secondary | ICD-10-CM

## 2023-01-17 LAB — BASIC METABOLIC PANEL
BUN: 11 mg/dL (ref 6–23)
CO2: 27 mEq/L (ref 19–32)
Calcium: 9.7 mg/dL (ref 8.4–10.5)
Chloride: 103 mEq/L (ref 96–112)
Creatinine, Ser: 0.84 mg/dL (ref 0.40–1.50)
GFR: 107.64 mL/min (ref >60.00)
Glucose, Bld: 88 mg/dL (ref 70–99)
Potassium: 4.7 mEq/L (ref 3.5–5.1)
Sodium: 139 mEq/L (ref 135–145)

## 2023-01-17 LAB — LIPID PANEL
Cholesterol: 150 mg/dL (ref 0–200)
HDL: 36 mg/dL — ABNORMAL LOW (ref >39.00)
LDL Cholesterol: 81 mg/dL (ref 0–99)
NonHDL: 114.16
Total CHOL/HDL Ratio: 4
Triglycerides: 165 mg/dL — ABNORMAL HIGH (ref 0.0–149.0)
VLDL: 33 mg/dL (ref 0.0–40.0)

## 2023-01-17 LAB — HEPATIC FUNCTION PANEL
ALT: 67 U/L — ABNORMAL HIGH (ref 0–53)
AST: 29 U/L (ref 0–37)
Albumin: 4.7 g/dL (ref 3.5–5.2)
Alkaline Phosphatase: 40 U/L (ref 39–117)
Bilirubin, Direct: 0.2 mg/dL (ref 0.0–0.3)
Total Bilirubin: 0.7 mg/dL (ref 0.2–1.2)
Total Protein: 7 g/dL (ref 6.0–8.3)

## 2023-01-17 MED ORDER — FLUOXETINE HCL 10 MG PO CAPS
20.0000 mg | ORAL_CAPSULE | Freq: Every day | ORAL | 1 refills | Status: DC
Start: 2023-01-17 — End: 2023-06-28

## 2023-01-17 NOTE — Progress Notes (Signed)
Established Patient Office Visit   Subjective:  Patient ID: Jeffrey Carrillo, male    DOB: 05/25/1981  Age: 42 y.o. MRN: [id number]  Chief Complaint  Patient presents with   Medical Management of Chronic Issues    2 month follow up on medication, no concerns. Patient fasting.     HPI Encounter Diagnoses  Name Primary?   NASH (nonalcoholic steatohepatitis) Yes   Anxiety with depression    Elevated ALT measurement    Elevated triglycerides with high cholesterol    Moderate persistent asthma without complication    Essential hypertension    Follow-up of above.  After using Wixela twice daily rescue inhaler use is diminished.  Prozac at 10 mg is helping.  No serious side effects.  Blood pressure well-controlled with lisinopril.  Vascepa is working well.  He sometimes forgets to take the evening dose.   Review of Systems  Constitutional: Negative.   HENT: Negative.    Eyes:  Negative for blurred vision, discharge and redness.  Respiratory: Negative.    Cardiovascular: Negative.   Gastrointestinal:  Negative for abdominal pain, nausea and vomiting.  Genitourinary: Negative.   Musculoskeletal: Negative.  Negative for myalgias.  Skin:  Negative for rash.  Neurological:  Negative for tingling, loss of consciousness and weakness.  Endo/Heme/Allergies:  Negative for polydipsia.      01/17/2023   10:05 AM 11/22/2022   10:10 AM 05/09/2022    9:29 AM  Depression screen PHQ 2/9  Decreased Interest 0 0 0  Down, Depressed, Hopeless 0 0 0  PHQ - 2 Score 0 0 0  Altered sleeping   0  Tired, decreased energy   1  Change in appetite   3  Feeling bad or failure about yourself    1  Trouble concentrating   0  Moving slowly or fidgety/restless   0  Suicidal thoughts   0  PHQ-9 Score   5  Difficult doing work/chores   Not difficult at all      Current Outpatient Medications:    albuterol (VENTOLIN HFA) 108 (90 Base) MCG/ACT inhaler, INHALE 1 PUFF INTO THE LUNGS EVERY 6 HOURS AS  NEEDED FOR WHEEZING OR SHORTNESS OF BREATH, Disp: 8.5 g, Rfl: 0   FLUoxetine (PROZAC) 10 MG capsule, Take 2 capsules (20 mg total) by mouth daily., Disp: 180 capsule, Rfl: 1   Icosapent Ethyl (VASCEPA) 0.5 g CAPS, Take 4 capsules (2 g total) by mouth 2 (two) times daily., Disp: 240 capsule, Rfl: 1   lisinopril (ZESTRIL) 10 MG tablet, TAKE 1 TABLET(10 MG) BY MOUTH DAILY., Disp: 90 tablet, Rfl: 2   pantoprazole (PROTONIX) 40 MG tablet, TAKE ONE TABLET BY MOUTH ONE TIME DAILY, Disp: 90 tablet, Rfl: 3   WIXELA INHUB 250-50 MCG/ACT AEPB, INHALE 1 PUFF INTO THE LUNGS EVERY 12 HOURS, Disp: 60 each, Rfl: 4   Objective:     BP 122/78 (BP Location: Right Arm, Patient Position: Sitting, Cuff Size: Large)   Pulse 84   Temp 98.7 F (37.1 C) (Temporal)   Ht 6\' 1"  (1.854 m)   Wt 252 lb 6.4 oz (114.5 kg)   SpO2 97%   BMI 33.30 kg/m  Wt Readings from Last 3 Encounters:  01/17/23 252 lb 6.4 oz (114.5 kg)  11/22/22 249 lb 6.4 oz (113.1 kg)  05/09/22 235 lb 9.6 oz (106.9 kg)      Physical Exam Constitutional:      General: He is not in acute distress.    Appearance: Normal  appearance. He is not ill-appearing, toxic-appearing or diaphoretic.  HENT:     Head: Normocephalic and atraumatic.     Right Ear: External ear normal.     Left Ear: External ear normal.  Eyes:     General: No scleral icterus.       Right eye: No discharge.        Left eye: No discharge.     Extraocular Movements: Extraocular movements intact.     Conjunctiva/sclera: Conjunctivae normal.  Pulmonary:     Effort: Pulmonary effort is normal. No respiratory distress.  Skin:    General: Skin is warm and dry.  Neurological:     Mental Status: He is alert and oriented to person, place, and time.  Psychiatric:        Mood and Affect: Mood normal.        Behavior: Behavior normal.      No results found for any visits on 01/17/23.    The 10-year ASCVD risk score (Arnett DK, et al., 2019) is: 1.4%    Assessment & Plan:    NASH (nonalcoholic steatohepatitis) -     Hepatic function panel -     MR ABDOMEN W WO CONTRAST; Future  Anxiety with depression -     FLUoxetine HCl; Take 2 capsules (20 mg total) by mouth daily.  Dispense: 180 capsule; Refill: 1  Elevated ALT measurement -     Hepatic function panel  Elevated triglycerides with high cholesterol -     Basic metabolic panel -     Hepatic function panel -     Lipid panel  Moderate persistent asthma without complication  Essential hypertension    Return in about 3 months (around 04/19/2023), or if symptoms worsen or fail to improve.   Have increased Prozac to 20 mg.  He will take 2 of the 20 mg capsules.  He has the option to decrease dosing back down to 10 mg daily. Mliss Sax, MD

## 2023-02-02 ENCOUNTER — Other Ambulatory Visit: Payer: Self-pay | Admitting: Family Medicine

## 2023-02-02 DIAGNOSIS — J4541 Moderate persistent asthma with (acute) exacerbation: Secondary | ICD-10-CM

## 2023-02-02 DIAGNOSIS — J4540 Moderate persistent asthma, uncomplicated: Secondary | ICD-10-CM

## 2023-02-11 ENCOUNTER — Other Ambulatory Visit: Payer: Self-pay | Admitting: Family Medicine

## 2023-02-11 DIAGNOSIS — I10 Essential (primary) hypertension: Secondary | ICD-10-CM

## 2023-03-18 ENCOUNTER — Other Ambulatory Visit: Payer: Self-pay | Admitting: Family Medicine

## 2023-03-18 DIAGNOSIS — J4541 Moderate persistent asthma with (acute) exacerbation: Secondary | ICD-10-CM

## 2023-04-03 ENCOUNTER — Encounter (INDEPENDENT_AMBULATORY_CARE_PROVIDER_SITE_OTHER): Payer: Self-pay

## 2023-04-19 ENCOUNTER — Ambulatory Visit: Payer: Managed Care, Other (non HMO) | Admitting: Family Medicine

## 2023-04-22 ENCOUNTER — Encounter: Payer: Self-pay | Admitting: Family Medicine

## 2023-04-22 ENCOUNTER — Ambulatory Visit: Payer: Managed Care, Other (non HMO) | Admitting: Family Medicine

## 2023-04-22 VITALS — BP 142/82 | HR 97 | Temp 98.1°F | Ht 73.0 in | Wt 256.4 lb

## 2023-04-22 DIAGNOSIS — E6609 Other obesity due to excess calories: Secondary | ICD-10-CM

## 2023-04-22 DIAGNOSIS — F418 Other specified anxiety disorders: Secondary | ICD-10-CM

## 2023-04-22 DIAGNOSIS — Z131 Encounter for screening for diabetes mellitus: Secondary | ICD-10-CM | POA: Diagnosis not present

## 2023-04-22 DIAGNOSIS — J4540 Moderate persistent asthma, uncomplicated: Secondary | ICD-10-CM

## 2023-04-22 DIAGNOSIS — Z6833 Body mass index (BMI) 33.0-33.9, adult: Secondary | ICD-10-CM

## 2023-04-22 MED ORDER — ZEPBOUND 5 MG/0.5ML ~~LOC~~ SOAJ
5.0000 mg | SUBCUTANEOUS | 3 refills | Status: DC
Start: 2023-04-22 — End: 2023-04-25

## 2023-04-22 NOTE — Progress Notes (Signed)
Established Patient Office Visit   Subjective:  Patient ID: Jeffrey Carrillo, male    DOB: 01/25/1981  Age: 42 y.o. MRN: [id number]  Chief Complaint  Patient presents with   Medical Management of Chronic Issues    3 month follow up. Pt is not fasting.     HPI Encounter Diagnoses  Name Primary?   Class 1 obesity due to excess calories without serious comorbidity with body mass index (BMI) of 33.0 to 33.9 in adult Yes   Moderate persistent asthma without complication    Screening for diabetes mellitus    Anxiety with depression    For follow-up of above.  Doing well with fluoxetine 20 mg daily.  Mood is definitely been lifted.  Wixela controlling asthma well.  Interested in losing weight.   Review of Systems  Constitutional: Negative.   HENT: Negative.    Eyes:  Negative for blurred vision, discharge and redness.  Respiratory: Negative.    Cardiovascular: Negative.   Gastrointestinal:  Negative for abdominal pain.  Genitourinary: Negative.   Musculoskeletal: Negative.  Negative for myalgias.  Skin:  Negative for rash.  Neurological:  Negative for tingling, loss of consciousness and weakness.  Endo/Heme/Allergies:  Negative for polydipsia.     Current Outpatient Medications:    albuterol (VENTOLIN HFA) 108 (90 Base) MCG/ACT inhaler, INHALE 1 PUFF INTO THE LUNGS EVERY 6 HOURS AS NEEDED FOR WHEEZING OR SHORTNESS OF BREATH, Disp: 8.5 g, Rfl: 0   FLUoxetine (PROZAC) 10 MG capsule, Take 2 capsules (20 mg total) by mouth daily., Disp: 180 capsule, Rfl: 1   Icosapent Ethyl (VASCEPA) 0.5 g CAPS, Take 4 capsules (2 g total) by mouth 2 (two) times daily., Disp: 240 capsule, Rfl: 1   lisinopril (ZESTRIL) 10 MG tablet, TAKE 1 TABLET DAILY, Disp: 90 tablet, Rfl: 3   pantoprazole (PROTONIX) 40 MG tablet, TAKE ONE TABLET BY MOUTH ONE TIME DAILY, Disp: 90 tablet, Rfl: 3   tirzepatide (ZEPBOUND) 5 MG/0.5ML Pen, Inject 5 mg into the skin once a week., Disp: 2 mL, Rfl: 3   WIXELA INHUB  250-50 MCG/ACT AEPB, INHALE 1 PUFF INTO THE LUNGS EVERY 12 HOURS, Disp: 60 each, Rfl: 4   Objective:     BP (!) 142/82   Pulse 97   Temp 98.1 F (36.7 C)   Ht 6\' 1"  (1.854 m)   Wt 256 lb 6.4 oz (116.3 kg)   SpO2 97%   BMI 33.83 kg/m  BP Readings from Last 3 Encounters:  04/22/23 (!) 142/82  01/17/23 122/78  11/22/22 128/74   Wt Readings from Last 3 Encounters:  04/22/23 256 lb 6.4 oz (116.3 kg)  01/17/23 252 lb 6.4 oz (114.5 kg)  11/22/22 249 lb 6.4 oz (113.1 kg)      Physical Exam Constitutional:      General: He is not in acute distress.    Appearance: Normal appearance. He is not ill-appearing, toxic-appearing or diaphoretic.  HENT:     Head: Normocephalic and atraumatic.     Right Ear: External ear normal.     Left Ear: External ear normal.  Eyes:     General: No scleral icterus.       Right eye: No discharge.        Left eye: No discharge.     Extraocular Movements: Extraocular movements intact.     Conjunctiva/sclera: Conjunctivae normal.  Cardiovascular:     Rate and Rhythm: Normal rate and regular rhythm.  Pulmonary:     Effort: Pulmonary effort  is normal. No respiratory distress.     Breath sounds: No wheezing, rhonchi or rales.  Skin:    General: Skin is warm and dry.  Neurological:     Mental Status: He is alert and oriented to person, place, and time.  Psychiatric:        Mood and Affect: Mood normal.        Behavior: Behavior normal.      No results found for any visits on 04/22/23.    The 10-year ASCVD risk score (Arnett DK, et al., 2019) is: 1.8%    Assessment & Plan:   Class 1 obesity due to excess calories without serious comorbidity with body mass index (BMI) of 33.0 to 33.9 in adult -     Zepbound; Inject 5 mg into the skin once a week.  Dispense: 2 mL; Refill: 3  Moderate persistent asthma without complication  Screening for diabetes mellitus -     Basic metabolic panel -     Hemoglobin A1c  Anxiety with  depression    Return in about 3 months (around 07/23/2023).  Would like to try Zepbound for weight loss.  Last A1c was 5.6.  He is seeing friends and lose weight with it.  Discussed the theoretical risk of contracting thyroid C cancer.  Discussed reported association with intestinal obstruction and/or pancreatitis.  Okay with weekly injections.  Warned about nausea with carbohydrate loading.  Calorie counting to lose weight with an 1800-calorie diet daily.  Doing well with Wixela.  Last albuterol inhaler filled on 7/15.  Continue Wixela at current dose.  Continue Prozac 20 mg daily.  Is working for him.  Mliss Sax, MD

## 2023-04-23 LAB — BASIC METABOLIC PANEL
BUN: 14 mg/dL (ref 6–23)
CO2: 25 meq/L (ref 19–32)
Calcium: 9.7 mg/dL (ref 8.4–10.5)
Chloride: 104 meq/L (ref 96–112)
Creatinine, Ser: 1.18 mg/dL (ref 0.40–1.50)
GFR: 76.02 mL/min (ref >60.00)
Glucose, Bld: 93 mg/dL (ref 70–99)
Potassium: 4.4 meq/L (ref 3.5–5.1)
Sodium: 138 meq/L (ref 135–145)

## 2023-04-23 LAB — HEMOGLOBIN A1C: Hgb A1c MFr Bld: 5.4 % (ref 4.6–6.5)

## 2023-04-25 ENCOUNTER — Other Ambulatory Visit (HOSPITAL_COMMUNITY): Payer: Self-pay

## 2023-04-25 ENCOUNTER — Other Ambulatory Visit: Payer: Self-pay

## 2023-04-25 ENCOUNTER — Other Ambulatory Visit: Payer: Self-pay | Admitting: Family Medicine

## 2023-04-25 DIAGNOSIS — J4541 Moderate persistent asthma with (acute) exacerbation: Secondary | ICD-10-CM

## 2023-04-25 DIAGNOSIS — E6609 Other obesity due to excess calories: Secondary | ICD-10-CM

## 2023-04-25 MED ORDER — ZEPBOUND 5 MG/0.5ML ~~LOC~~ SOAJ
5.0000 mg | SUBCUTANEOUS | 3 refills | Status: DC
Start: 2023-04-25 — End: 2024-01-10

## 2023-04-25 NOTE — Telephone Encounter (Signed)
[facility] called and for some reason the zepbound is not showing on his chart there. Please resend.

## 2023-04-29 ENCOUNTER — Telehealth: Payer: Self-pay

## 2023-04-29 NOTE — Telephone Encounter (Signed)
Received fax from [facility] that Zepbound has been denied.  Please Advise

## 2023-05-20 ENCOUNTER — Other Ambulatory Visit: Payer: Self-pay | Admitting: Family Medicine

## 2023-05-20 DIAGNOSIS — J4541 Moderate persistent asthma with (acute) exacerbation: Secondary | ICD-10-CM

## 2023-06-27 ENCOUNTER — Other Ambulatory Visit: Payer: Self-pay | Admitting: Family Medicine

## 2023-06-27 DIAGNOSIS — F418 Other specified anxiety disorders: Secondary | ICD-10-CM

## 2023-07-10 ENCOUNTER — Encounter: Payer: Self-pay | Admitting: Family Medicine

## 2023-07-23 ENCOUNTER — Ambulatory Visit: Payer: Managed Care, Other (non HMO) | Admitting: Family Medicine

## 2023-11-03 ENCOUNTER — Other Ambulatory Visit: Payer: Self-pay | Admitting: Family Medicine

## 2023-11-03 DIAGNOSIS — J4541 Moderate persistent asthma with (acute) exacerbation: Secondary | ICD-10-CM

## 2023-12-11 ENCOUNTER — Other Ambulatory Visit: Payer: Self-pay | Admitting: Family Medicine

## 2023-12-11 DIAGNOSIS — J4540 Moderate persistent asthma, uncomplicated: Secondary | ICD-10-CM

## 2024-01-01 ENCOUNTER — Other Ambulatory Visit: Payer: Self-pay | Admitting: Family Medicine

## 2024-01-01 ENCOUNTER — Other Ambulatory Visit: Payer: Self-pay

## 2024-01-01 DIAGNOSIS — K219 Gastro-esophageal reflux disease without esophagitis: Secondary | ICD-10-CM

## 2024-01-01 MED ORDER — PANTOPRAZOLE SODIUM 40 MG PO TBEC
40.0000 mg | DELAYED_RELEASE_TABLET | Freq: Every day | ORAL | 0 refills | Status: DC
Start: 2024-01-01 — End: 2024-07-13

## 2024-01-10 ENCOUNTER — Encounter: Payer: Self-pay | Admitting: Family Medicine

## 2024-01-10 ENCOUNTER — Ambulatory Visit: Admitting: Family Medicine

## 2024-01-10 VITALS — BP 124/80 | HR 98 | Temp 98.0°F | Ht 73.0 in | Wt 253.8 lb

## 2024-01-10 DIAGNOSIS — Z6833 Body mass index (BMI) 33.0-33.9, adult: Secondary | ICD-10-CM

## 2024-01-10 DIAGNOSIS — E66811 Obesity, class 1: Secondary | ICD-10-CM | POA: Diagnosis not present

## 2024-01-10 DIAGNOSIS — E6609 Other obesity due to excess calories: Secondary | ICD-10-CM

## 2024-01-10 DIAGNOSIS — E782 Mixed hyperlipidemia: Secondary | ICD-10-CM | POA: Diagnosis not present

## 2024-01-10 DIAGNOSIS — R7401 Elevation of levels of liver transaminase levels: Secondary | ICD-10-CM

## 2024-01-10 DIAGNOSIS — I10 Essential (primary) hypertension: Secondary | ICD-10-CM | POA: Diagnosis not present

## 2024-01-10 DIAGNOSIS — K7581 Nonalcoholic steatohepatitis (NASH): Secondary | ICD-10-CM | POA: Diagnosis not present

## 2024-01-10 DIAGNOSIS — R0683 Snoring: Secondary | ICD-10-CM

## 2024-01-10 LAB — COMPREHENSIVE METABOLIC PANEL WITH GFR
ALT: 70 U/L — ABNORMAL HIGH (ref 0–53)
AST: 31 U/L (ref 0–37)
Albumin: 5.1 g/dL (ref 3.5–5.2)
Alkaline Phosphatase: 49 U/L (ref 39–117)
BUN: 11 mg/dL (ref 6–23)
CO2: 27 meq/L (ref 19–32)
Calcium: 10 mg/dL (ref 8.4–10.5)
Chloride: 101 meq/L (ref 96–112)
Creatinine, Ser: 0.89 mg/dL (ref 0.40–1.50)
GFR: 105.05 mL/min (ref >60.00)
Glucose, Bld: 91 mg/dL (ref 70–99)
Potassium: 4.3 meq/L (ref 3.5–5.1)
Sodium: 137 meq/L (ref 135–145)
Total Bilirubin: 0.8 mg/dL (ref 0.2–1.2)
Total Protein: 7.4 g/dL (ref 6.0–8.3)

## 2024-01-10 LAB — URINALYSIS, ROUTINE W REFLEX MICROSCOPIC
Bilirubin Urine: NEGATIVE
Hgb urine dipstick: NEGATIVE
Ketones, ur: NEGATIVE
Leukocytes,Ua: NEGATIVE
Nitrite: NEGATIVE
Specific Gravity, Urine: 1.015 (ref 1.000–1.030)
Total Protein, Urine: NEGATIVE
Urine Glucose: NEGATIVE
Urobilinogen, UA: 0.2 (ref 0.0–1.0)
pH: 7 (ref 5.0–8.0)

## 2024-01-10 LAB — CBC WITH DIFFERENTIAL/PLATELET
Basophils Absolute: 0.1 10*3/uL (ref 0.0–0.1)
Basophils Relative: 0.9 % (ref 0.0–3.0)
Eosinophils Absolute: 0.2 10*3/uL (ref 0.0–0.7)
Eosinophils Relative: 2.8 % (ref 0.0–5.0)
HCT: 44.9 % (ref 39.0–52.0)
Hemoglobin: 15.2 g/dL (ref 13.0–17.0)
Lymphocytes Relative: 28 % (ref 12.0–46.0)
Lymphs Abs: 2 10*3/uL (ref 0.7–4.0)
MCHC: 33.9 g/dL (ref 30.0–36.0)
MCV: 88.9 fl (ref 78.0–100.0)
Monocytes Absolute: 0.5 10*3/uL (ref 0.1–1.0)
Monocytes Relative: 7.4 % (ref 3.0–12.0)
Neutro Abs: 4.3 10*3/uL (ref 1.4–7.7)
Neutrophils Relative %: 60.9 % (ref 43.0–77.0)
Platelets: 384 10*3/uL (ref 150.0–400.0)
RBC: 5.05 Mil/uL (ref 4.22–5.81)
RDW: 14 % (ref 11.5–15.5)
WBC: 7 10*3/uL (ref 4.0–10.5)

## 2024-01-10 LAB — LIPID PANEL
Cholesterol: 156 mg/dL (ref 0–200)
HDL: 36.1 mg/dL — ABNORMAL LOW (ref >39.00)
LDL Cholesterol: 82 mg/dL (ref 0–99)
NonHDL: 119.84
Total CHOL/HDL Ratio: 4
Triglycerides: 187 mg/dL — ABNORMAL HIGH (ref 0.0–149.0)
VLDL: 37.4 mg/dL (ref 0.0–40.0)

## 2024-01-10 LAB — LDL CHOLESTEROL, DIRECT: Direct LDL: 101 mg/dL

## 2024-01-10 MED ORDER — TIRZEPATIDE 2.5 MG/0.5ML ~~LOC~~ SOAJ
2.5000 mg | SUBCUTANEOUS | 3 refills | Status: DC
Start: 2024-01-10 — End: 2024-01-17

## 2024-01-10 NOTE — Progress Notes (Signed)
 Established Patient Office Visit   Subjective:  Patient ID: Jeffrey Carrillo, male    DOB: November 20, 1980  Age: 43 y.o. MRN: [id number]  Chief Complaint  Patient presents with   Medical Management of Chronic Issues    Follow up. Pt is fasting. Pt wants to try and get Zepbound  covered.     HPI Encounter Diagnoses  Name Primary?   Class 1 obesity due to excess calories without serious comorbidity with body mass index (BMI) of 33.0 to 33.9 in adult Yes   NASH (nonalcoholic steatohepatitis)    Elevated triglycerides with high cholesterol    Essential hypertension    Snores    Class 1 obesity due to excess calories with body mass index (BMI) of 33.0 to 33.9 in adult, unspecified whether serious comorbidity present    For follow-up of above.  He has been able to lose about 10 pounds.  He is dieting by reducing portions and following the guidelines of weight watchers.  He is walking his dog for 30 minutes 3-4 times weekly.  His wife is recently noted loud snoring.  She has had to go sleep in another bedroom.  He has been using the nasal strips.   Review of Systems  Constitutional: Negative.   HENT: Negative.    Eyes:  Negative for blurred vision, discharge and redness.  Respiratory: Negative.    Cardiovascular: Negative.   Gastrointestinal:  Negative for abdominal pain.  Genitourinary: Negative.   Musculoskeletal: Negative.  Negative for myalgias.  Skin:  Negative for rash.  Neurological:  Negative for tingling, loss of consciousness and weakness.  Endo/Heme/Allergies:  Negative for polydipsia.     Current Outpatient Medications:    albuterol  (VENTOLIN  HFA) 108 (90 Base) MCG/ACT inhaler, INHALE 1 PUFF INTO THE LUNGS EVERY 6 HOURS AS NEEDED FOR WHEEZING OR SHORTNESS OF BREATH, Disp: 6.7 g, Rfl: 0   FLUoxetine  (PROZAC ) 10 MG capsule, TAKE 2 CAPSULES DAILY, Disp: 180 capsule, Rfl: 3   Icosapent  Ethyl (VASCEPA ) 0.5 g CAPS, Take 4 capsules (2 g total) by mouth 2 (two) times daily., Disp:  240 capsule, Rfl: 1   lisinopril  (ZESTRIL ) 10 MG tablet, TAKE 1 TABLET DAILY, Disp: 90 tablet, Rfl: 3   pantoprazole  (PROTONIX ) 40 MG tablet, TAKE ONE TABLET BY MOUTH ONE TIME DAILY, Disp: 90 tablet, Rfl: 3   pantoprazole  (PROTONIX ) 40 MG tablet, Take 1 tablet (40 mg total) by mouth daily., Disp: 90 tablet, Rfl: 0   tirzepatide  (MOUNJARO) 2.5 MG/0.5ML Pen, Inject 2.5 mg into the skin once a week., Disp: 2 mL, Rfl: 3   WIXELA INHUB 250-50 MCG/ACT AEPB, INHALE 1 PUFF INTO THE LUNGS EVERY 12 HOURS, Disp: 60 each, Rfl: 4   Objective:     BP 124/80 (BP Location: Right Arm, Patient Position: Sitting, Cuff Size: Normal)   Pulse 98   Temp 98 F (36.7 C) (Temporal)   Ht 6\' 1"  (1.854 m)   Wt 253 lb 12.8 oz (115.1 kg)   SpO2 100%   BMI 33.48 kg/m  BP Readings from Last 3 Encounters:  01/10/24 124/80  04/22/23 (!) 142/82  01/17/23 122/78   Wt Readings from Last 3 Encounters:  01/10/24 253 lb 12.8 oz (115.1 kg)  04/22/23 256 lb 6.4 oz (116.3 kg)  01/17/23 252 lb 6.4 oz (114.5 kg)      Physical Exam Constitutional:      General: He is not in acute distress.    Appearance: Normal appearance. He is not ill-appearing, toxic-appearing or diaphoretic.  HENT:     Head: Normocephalic and atraumatic.     Right Ear: External ear normal.     Left Ear: External ear normal.     Mouth/Throat:     Mouth: Mucous membranes are moist.     Pharynx: Oropharynx is clear. No oropharyngeal exudate or posterior oropharyngeal erythema.  Eyes:     General: No scleral icterus.       Right eye: No discharge.        Left eye: No discharge.     Extraocular Movements: Extraocular movements intact.     Conjunctiva/sclera: Conjunctivae normal.  Cardiovascular:     Rate and Rhythm: Normal rate and regular rhythm.  Pulmonary:     Effort: Pulmonary effort is normal. No respiratory distress.     Breath sounds: No wheezing, rhonchi or rales.  Musculoskeletal:     Cervical back: No rigidity or tenderness.   Lymphadenopathy:     Cervical: No cervical adenopathy.  Skin:    General: Skin is warm and dry.  Neurological:     Mental Status: He is alert and oriented to person, place, and time.  Psychiatric:        Mood and Affect: Mood normal.        Behavior: Behavior normal.      No results found for any visits on 01/10/24.    The 10-year ASCVD risk score (Arnett DK, et al., 2019) is: 1.6%    Assessment & Plan:   Class 1 obesity due to excess calories without serious comorbidity with body mass index (BMI) of 33.0 to 33.9 in adult  NASH (nonalcoholic steatohepatitis) -     Comprehensive metabolic panel with GFR  Elevated triglycerides with high cholesterol -     Comprehensive metabolic panel with GFR -     LDL cholesterol, direct -     Lipid panel  Essential hypertension -     CBC with Differential/Platelet -     Comprehensive metabolic panel with GFR -     Urinalysis, Routine w reflex microscopic  Snores -     Tirzepatide ; Inject 2.5 mg into the skin once a week.  Dispense: 2 mL; Refill: 3 -     Pulmonary Visit  Class 1 obesity due to excess calories with body mass index (BMI) of 33.0 to 33.9 in adult, unspecified whether serious comorbidity present -     Tirzepatide ; Inject 2.5 mg into the skin once a week.  Dispense: 2 mL; Refill: 3    Return in about 3 months (around 04/11/2024), or if symptoms worsen or fail to improve.  Regarding GLP-1 agonist, discussed theoretical risk of thyroid  C cancer.  Discussed common side effects of nausea with constipation or diarrhea.  Discussed precautions of significant abdominal pain with the possibility of gallbladder disease, intestinal obstruction or pancreatitis.  Advised 30 minutes of exercise most days of the week for a total of 150 minutes.  Continue reduced calorie diet per weight watchers guidelines.  Will start tirzepatide  2.5 mg weekly.     Tonna Frederic, MD

## 2024-01-13 MED ORDER — ICOSAPENT ETHYL 1 G PO CAPS: 2.0000 g | ORAL_CAPSULE | Freq: Two times a day (BID) | ORAL | 2 refills | Status: AC

## 2024-01-13 NOTE — Addendum Note (Signed)
 Addended by: Delene Feinstein on: 01/13/2024 12:23 PM   Modules accepted: Orders

## 2024-01-13 NOTE — Addendum Note (Signed)
 Addended by: Delene Feinstein on: 01/13/2024 12:27 PM   Modules accepted: Orders

## 2024-01-14 ENCOUNTER — Ambulatory Visit: Payer: Self-pay

## 2024-01-16 ENCOUNTER — Other Ambulatory Visit: Payer: Self-pay | Admitting: Family Medicine

## 2024-01-16 DIAGNOSIS — J4541 Moderate persistent asthma with (acute) exacerbation: Secondary | ICD-10-CM

## 2024-01-17 ENCOUNTER — Other Ambulatory Visit: Payer: Self-pay

## 2024-01-17 ENCOUNTER — Telehealth: Payer: Self-pay

## 2024-01-17 ENCOUNTER — Encounter: Payer: Self-pay | Admitting: Family Medicine

## 2024-01-17 ENCOUNTER — Other Ambulatory Visit (HOSPITAL_COMMUNITY): Payer: Self-pay

## 2024-01-17 DIAGNOSIS — R0683 Snoring: Secondary | ICD-10-CM

## 2024-01-17 DIAGNOSIS — E6609 Other obesity due to excess calories: Secondary | ICD-10-CM

## 2024-01-17 DIAGNOSIS — E66811 Obesity, class 1: Secondary | ICD-10-CM

## 2024-01-17 MED ORDER — TIRZEPATIDE 2.5 MG/0.5ML ~~LOC~~ SOAJ: 2.5000 mg | SUBCUTANEOUS | 3 refills | Status: DC

## 2024-01-17 NOTE — Telephone Encounter (Signed)

## 2024-01-20 ENCOUNTER — Other Ambulatory Visit (HOSPITAL_COMMUNITY): Payer: Self-pay

## 2024-01-20 MED ORDER — TIRZEPATIDE-WEIGHT MANAGEMENT 2.5 MG/0.5ML ~~LOC~~ SOLN
2.5000 mg | SUBCUTANEOUS | 2 refills | Status: DC
Start: 2024-01-20 — End: 2024-02-10

## 2024-02-04 ENCOUNTER — Other Ambulatory Visit: Payer: Self-pay

## 2024-02-04 ENCOUNTER — Encounter: Payer: Self-pay | Admitting: Family Medicine

## 2024-02-04 DIAGNOSIS — J4540 Moderate persistent asthma, uncomplicated: Secondary | ICD-10-CM

## 2024-02-04 MED ORDER — FLUTICASONE-SALMETEROL 250-50 MCG/ACT IN AEPB: 1.0000 | INHALATION_SPRAY | Freq: Two times a day (BID) | RESPIRATORY_TRACT | 4 refills | Status: AC

## 2024-02-05 ENCOUNTER — Other Ambulatory Visit: Payer: Self-pay | Admitting: Family Medicine

## 2024-02-05 DIAGNOSIS — I10 Essential (primary) hypertension: Secondary | ICD-10-CM

## 2024-02-10 ENCOUNTER — Other Ambulatory Visit: Payer: Self-pay | Admitting: Family Medicine

## 2024-02-10 ENCOUNTER — Other Ambulatory Visit: Payer: Self-pay

## 2024-02-10 ENCOUNTER — Encounter: Payer: Self-pay | Admitting: Family Medicine

## 2024-02-10 DIAGNOSIS — J4541 Moderate persistent asthma with (acute) exacerbation: Secondary | ICD-10-CM

## 2024-02-10 DIAGNOSIS — E66811 Obesity, class 1: Secondary | ICD-10-CM

## 2024-02-10 DIAGNOSIS — E6609 Other obesity due to excess calories: Secondary | ICD-10-CM

## 2024-02-10 MED ORDER — TIRZEPATIDE-WEIGHT MANAGEMENT 2.5 MG/0.5ML ~~LOC~~ SOLN: 2.5000 mg | SUBCUTANEOUS | 2 refills | Status: DC

## 2024-02-11 ENCOUNTER — Telehealth: Payer: Self-pay

## 2024-02-11 ENCOUNTER — Other Ambulatory Visit (HOSPITAL_COMMUNITY): Payer: Self-pay

## 2024-02-11 NOTE — Telephone Encounter (Signed)
 Pharmacy Patient Advocate Encounter   Received notification from Patient Pharmacy that prior authorization for Zepbound  2.5 is required/requested.   Insurance verification completed.   The patient is insured through [employer] .   Per test claim: PA required; PA submitted to above mentioned insurance via CoverMyMeds Key/confirmation #/EOC [id number] Status is pending

## 2024-02-13 ENCOUNTER — Encounter: Payer: Self-pay | Admitting: Family Medicine

## 2024-02-17 ENCOUNTER — Other Ambulatory Visit (HOSPITAL_COMMUNITY): Payer: Self-pay

## 2024-02-17 NOTE — Telephone Encounter (Signed)
 Pharmacy Patient Advocate Encounter  Received notification from [employer] that Prior Authorization for Zepbound  2.5 has been APPROVED from 02/11/24 to 10/13/24. Ran test claim, Copay is $49.99. This test claim was processed through [facility]- copay amounts may vary at other pharmacies due to pharmacy/plan contracts, or as the patient moves through the different stages of their insurance plan.   PA #/Case ID/Reference #: [id number]

## 2024-03-11 ENCOUNTER — Ambulatory Visit (HOSPITAL_BASED_OUTPATIENT_CLINIC_OR_DEPARTMENT_OTHER): Admitting: Primary Care

## 2024-03-25 ENCOUNTER — Encounter: Payer: Self-pay | Admitting: Family Medicine

## 2024-03-25 DIAGNOSIS — E66811 Obesity, class 1: Secondary | ICD-10-CM

## 2024-03-26 MED ORDER — TIRZEPATIDE-WEIGHT MANAGEMENT 5 MG/0.5ML ~~LOC~~ SOLN: 5.0000 mg | SUBCUTANEOUS | 0 refills | Status: DC

## 2024-04-29 ENCOUNTER — Other Ambulatory Visit: Payer: Self-pay | Admitting: Family Medicine

## 2024-04-29 DIAGNOSIS — E66811 Obesity, class 1: Secondary | ICD-10-CM

## 2024-04-30 ENCOUNTER — Other Ambulatory Visit (HOSPITAL_COMMUNITY): Payer: Self-pay

## 2024-06-22 ENCOUNTER — Other Ambulatory Visit: Payer: Self-pay | Admitting: Family Medicine

## 2024-06-22 DIAGNOSIS — F418 Other specified anxiety disorders: Secondary | ICD-10-CM

## 2024-06-26 ENCOUNTER — Other Ambulatory Visit: Payer: Self-pay | Admitting: Family Medicine

## 2024-06-26 DIAGNOSIS — J4541 Moderate persistent asthma with (acute) exacerbation: Secondary | ICD-10-CM

## 2024-07-13 ENCOUNTER — Encounter: Payer: Self-pay | Admitting: Family Medicine

## 2024-07-13 ENCOUNTER — Ambulatory Visit: Admitting: Family Medicine

## 2024-07-13 VITALS — BP 124/84 | HR 70 | Temp 98.0°F | Ht 73.0 in | Wt 225.4 lb

## 2024-07-13 DIAGNOSIS — K7581 Nonalcoholic steatohepatitis (NASH): Secondary | ICD-10-CM

## 2024-07-13 DIAGNOSIS — Z6829 Body mass index (BMI) 29.0-29.9, adult: Secondary | ICD-10-CM | POA: Diagnosis not present

## 2024-07-13 DIAGNOSIS — Z23 Encounter for immunization: Secondary | ICD-10-CM

## 2024-07-13 DIAGNOSIS — J4540 Moderate persistent asthma, uncomplicated: Secondary | ICD-10-CM

## 2024-07-13 DIAGNOSIS — F418 Other specified anxiety disorders: Secondary | ICD-10-CM | POA: Diagnosis not present

## 2024-07-13 LAB — HEPATIC FUNCTION PANEL
ALT: 38 U/L (ref 0–53)
AST: 25 U/L (ref 0–37)
Albumin: 4.7 g/dL (ref 3.5–5.2)
Alkaline Phosphatase: 42 U/L (ref 39–117)
Bilirubin, Direct: 0.1 mg/dL (ref 0.0–0.3)
Total Bilirubin: 0.7 mg/dL (ref 0.2–1.2)
Total Protein: 7.1 g/dL (ref 6.0–8.3)

## 2024-07-13 MED ORDER — FLUOXETINE HCL 10 MG PO CAPS: 20.0000 mg | ORAL_CAPSULE | Freq: Every day | ORAL | 3 refills | Status: DC

## 2024-07-13 MED ORDER — TIRZEPATIDE-WEIGHT MANAGEMENT 7.5 MG/0.5ML ~~LOC~~ SOLN: 7.5000 mg | SUBCUTANEOUS | 5 refills | Status: DC

## 2024-07-13 NOTE — Progress Notes (Addendum)
 "  Established Patient Office Visit   Subjective:  Patient ID: Jeffrey Carrillo, male    DOB: 07/18/1981  Age: 43 y.o. MRN: [id number]  Chief Complaint  Patient presents with   Medical Management of Chronic Issues    Follow up. Pt needs Rx for Prozac . Pt is fasting.     HPI Encounter Diagnoses  Name Primary?   NASH (nonalcoholic steatohepatitis) Yes   Anxiety with depression    BMI 29.0-29.9,adult    Immunization due    Moderate persistent asthma without complication    For follow-up of above.  Doing well with the Zepbound .  Continues Vascepa .  Denies nausea vomiting, constipation or diarrhea with the Zepbound .  He continues to exercise regularly and has also reduced caloric intake.  He has been able to lose 28 pounds.  Prozac  has been helpful.  Feels as though anxiety and depression are well controlled.  Continues Wixela for control of his asthma.  Yes   Review of Systems  Constitutional: Negative.   HENT: Negative.    Eyes:  Negative for blurred vision, discharge and redness.  Respiratory: Negative.    Cardiovascular: Negative.   Gastrointestinal:  Negative for abdominal pain.  Genitourinary: Negative.   Musculoskeletal: Negative.  Negative for myalgias.  Skin:  Negative for rash.  Neurological:  Negative for tingling, loss of consciousness and weakness.  Endo/Heme/Allergies:  Negative for polydipsia.     Current Outpatient Medications:    albuterol  (VENTOLIN  HFA) 108 (90 Base) MCG/ACT inhaler, INHALE 1 PUFF INTO THE LUNGS EVERY 6 HOURS AS NEEDED FOR WHEEZING OR SHORTNESS OF BREATH, Disp: 6.7 g, Rfl: 1   fluticasone -salmeterol (WIXELA INHUB) 250-50 MCG/ACT AEPB, Inhale 1 puff into the lungs in the morning and at bedtime., Disp: 60 each, Rfl: 4   icosapent  Ethyl (VASCEPA ) 1 g capsule, Take 2 capsules (2 g total) by mouth 2 (two) times daily., Disp: 360 capsule, Rfl: 2   lisinopril  (ZESTRIL ) 10 MG tablet, TAKE 1 TABLET DAILY, Disp: 90 tablet, Rfl: 3   pantoprazole   (PROTONIX ) 40 MG tablet, TAKE ONE TABLET BY MOUTH ONE TIME DAILY, Disp: 90 tablet, Rfl: 3   FLUoxetine  (PROZAC ) 10 MG capsule, Take 2 capsules (20 mg total) by mouth daily., Disp: 180 capsule, Rfl: 3   tirzepatide  (ZEPBOUND ) 7.5 MG/0.5ML Pen, Inject 7.5 mg into the skin once a week., Disp: 6.5 mL, Rfl: 5   Objective:     BP 124/84 (BP Location: Right Arm, Patient Position: Sitting, Cuff Size: Large)   Pulse 70   Temp 98 F (36.7 C) (Oral)   Ht 6' 1 (1.854 m)   Wt 225 lb 6.4 oz (102.2 kg)   SpO2 98%   BMI 29.74 kg/m  BP Readings from Last 3 Encounters:  07/13/24 124/84  01/10/24 124/80  04/22/23 (!) 142/82   Wt Readings from Last 3 Encounters:  07/13/24 225 lb 6.4 oz (102.2 kg)  01/10/24 253 lb 12.8 oz (115.1 kg)  04/22/23 256 lb 6.4 oz (116.3 kg)      Physical Exam Constitutional:      General: He is not in acute distress.    Appearance: Normal appearance. He is not ill-appearing, toxic-appearing or diaphoretic.  HENT:     Head: Normocephalic and atraumatic.     Right Ear: External ear normal.     Left Ear: External ear normal.  Eyes:     General: No scleral icterus.       Right eye: No discharge.  Left eye: No discharge.     Extraocular Movements: Extraocular movements intact.     Conjunctiva/sclera: Conjunctivae normal.  Pulmonary:     Effort: Pulmonary effort is normal. No respiratory distress.  Skin:    General: Skin is warm and dry.  Neurological:     Mental Status: He is alert and oriented to person, place, and time.  Psychiatric:        Mood and Affect: Mood normal.        Behavior: Behavior normal.      Results for orders placed or performed in visit on 07/13/24  Hepatic function panel  Result Value Ref Range   Total Bilirubin 0.7 0.2 - 1.2 mg/dL   Bilirubin, Direct 0.1 0.0 - 0.3 mg/dL   Alkaline Phosphatase 42 39 - 117 U/L   AST 25 0 - 37 U/L   ALT 38 0 - 53 U/L   Total Protein 7.1 6.0 - 8.3 g/dL   Albumin 4.7 3.5 - 5.2 g/dL       The 89-bzjm ASCVD risk score (Arnett DK, et al., 2019) is: 2%    Assessment & Plan:   NASH (nonalcoholic steatohepatitis) -     Hepatic function panel  Anxiety with depression  BMI 29.0-29.9,adult  Immunization due -     Flu vaccine trivalent PF, 6mos and older(Flulaval,Afluria,Fluarix,Fluzone)  Moderate persistent asthma without complication -     Pulmonary Visit    Return in about 6 months (around 01/10/2025), or if symptoms worsen or fail to improve.  Increase Zepbound  to 7.5 mg weekly.  He will start weight training to prevent sarcopenia.  Continue regular exercise and caloric restriction.  Continue fluoxetine  20 mg daily.  Elsie Sim Lent, MD  09/04/24 addendum: Continues overuse of albuterol .  Have referred to pulmonology for second opinion. "

## 2024-07-14 ENCOUNTER — Ambulatory Visit: Payer: Self-pay | Admitting: Family Medicine

## 2024-08-10 ENCOUNTER — Encounter: Payer: Self-pay | Admitting: Family Medicine

## 2024-08-10 DIAGNOSIS — F418 Other specified anxiety disorders: Secondary | ICD-10-CM

## 2024-08-10 DIAGNOSIS — K7581 Nonalcoholic steatohepatitis (NASH): Secondary | ICD-10-CM

## 2024-08-11 MED ORDER — ZEPBOUND 7.5 MG/0.5ML ~~LOC~~ SOAJ: 7.5000 mg | SUBCUTANEOUS | 5 refills | Status: DC

## 2024-08-11 NOTE — Addendum Note (Signed)
 Addended by: BERNETA ELSIE LABOR on: 08/11/2024 12:45 PM   Modules accepted: Orders

## 2024-08-11 NOTE — Telephone Encounter (Signed)
 Refill request received for Fluoxetine  20mg . Pt is requesting that this medication be sent to [facility] instead of the [facility] pharmacy on file.  FOV: NONE LOV: 07/13/2024 Last refill: 07/13/2024 Medication is pending your approval. I have changed the pharmacy to [facility].  ** Once this is sent, This message needs to be routed to Rx PA Team so that they can get started on a PA for pts Zepbound  7.5mg . Thank you

## 2024-08-13 MED ORDER — ZEPBOUND 7.5 MG/0.5ML ~~LOC~~ SOAJ: 7.5000 mg | SUBCUTANEOUS | 5 refills | Status: AC

## 2024-08-13 MED ORDER — FLUOXETINE HCL 10 MG PO CAPS: 20.0000 mg | ORAL_CAPSULE | Freq: Every day | ORAL | 3 refills | Status: AC

## 2024-08-13 NOTE — Addendum Note (Signed)
 Addended by: KYM KARNA CROME on: 08/13/2024 02:36 PM   Modules accepted: Orders

## 2024-09-01 ENCOUNTER — Other Ambulatory Visit: Payer: Self-pay | Admitting: Family Medicine

## 2024-09-01 DIAGNOSIS — J4541 Moderate persistent asthma with (acute) exacerbation: Secondary | ICD-10-CM

## 2024-09-04 NOTE — Addendum Note (Signed)
 Addended by: BERNETA ELSIE LABOR on: 09/04/2024 08:35 AM   Modules accepted: Orders
